# Patient Record
Sex: Male | Born: 1965 | Race: White | Hispanic: No | Marital: Married | Smoking: Never smoker
Health system: Southern US, Community
[De-identification: ages and names within clinical notes are randomized; demographics above are authoritative.]

## PROBLEM LIST (undated history)

## (undated) DIAGNOSIS — E7800 Pure hypercholesterolemia, unspecified: Secondary | ICD-10-CM

## (undated) DIAGNOSIS — E119 Type 2 diabetes mellitus without complications: Secondary | ICD-10-CM

## (undated) DIAGNOSIS — F32A Depression, unspecified: Secondary | ICD-10-CM

## (undated) DIAGNOSIS — Z83719 Family history of colon polyps, unspecified: Secondary | ICD-10-CM

## (undated) DIAGNOSIS — F329 Major depressive disorder, single episode, unspecified: Secondary | ICD-10-CM

## (undated) DIAGNOSIS — K589 Irritable bowel syndrome without diarrhea: Secondary | ICD-10-CM

## (undated) DIAGNOSIS — R319 Hematuria, unspecified: Secondary | ICD-10-CM

## (undated) DIAGNOSIS — Z8371 Family history of colonic polyps: Secondary | ICD-10-CM

## (undated) DIAGNOSIS — T7840XA Allergy, unspecified, initial encounter: Secondary | ICD-10-CM

## (undated) HISTORY — DX: Depression, unspecified: F32.A

## (undated) HISTORY — DX: Allergy, unspecified, initial encounter: T78.40XA

## (undated) HISTORY — DX: Family history of colonic polyps: Z83.71

## (undated) HISTORY — DX: Hematuria, unspecified: R31.9

## (undated) HISTORY — DX: Irritable bowel syndrome, unspecified: K58.9

## (undated) HISTORY — DX: Family history of colon polyps, unspecified: Z83.719

## (undated) HISTORY — DX: Major depressive disorder, single episode, unspecified: F32.9

## (undated) HISTORY — DX: Type 2 diabetes mellitus without complications: E11.9

## (undated) HISTORY — DX: Pure hypercholesterolemia, unspecified: E78.00

---

## 1998-01-16 ENCOUNTER — Other Ambulatory Visit: Admission: RE | Admit: 1998-01-16 | Discharge: 1998-01-16 | Payer: Self-pay | Admitting: Internal Medicine

## 2008-06-24 ENCOUNTER — Encounter: Admission: RE | Admit: 2008-06-24 | Discharge: 2008-06-24 | Payer: Self-pay | Admitting: Family Medicine

## 2010-03-03 ENCOUNTER — Ambulatory Visit (HOSPITAL_COMMUNITY): Admission: RE | Admit: 2010-03-03 | Discharge: 2010-03-03 | Payer: Self-pay | Admitting: Family Medicine

## 2010-08-05 ENCOUNTER — Other Ambulatory Visit (HOSPITAL_COMMUNITY): Payer: Self-pay | Admitting: Cardiology

## 2010-08-08 ENCOUNTER — Encounter: Payer: Self-pay | Admitting: Cardiology

## 2010-08-23 ENCOUNTER — Encounter (HOSPITAL_COMMUNITY): Payer: Self-pay

## 2010-08-23 ENCOUNTER — Other Ambulatory Visit (HOSPITAL_COMMUNITY): Payer: Self-pay

## 2010-08-23 ENCOUNTER — Ambulatory Visit (HOSPITAL_COMMUNITY)
Admission: RE | Admit: 2010-08-23 | Discharge: 2010-08-23 | Disposition: A | Discharge status: Home / Self Care | Payer: 59 | Source: Ambulatory Visit | Attending: Cardiology | Admitting: Cardiology

## 2010-08-23 DIAGNOSIS — R079 Chest pain, unspecified: Secondary | ICD-10-CM | POA: Insufficient documentation

## 2011-11-14 ENCOUNTER — Other Ambulatory Visit (HOSPITAL_COMMUNITY): Payer: Self-pay | Admitting: Family Medicine

## 2011-11-14 DIAGNOSIS — R7989 Other specified abnormal findings of blood chemistry: Secondary | ICD-10-CM

## 2011-11-18 ENCOUNTER — Ambulatory Visit (HOSPITAL_COMMUNITY)
Admission: RE | Admit: 2011-11-18 | Discharge: 2011-11-18 | Disposition: A | Discharge status: Home / Self Care | Payer: 59 | Source: Ambulatory Visit | Attending: Family Medicine | Admitting: Family Medicine

## 2011-11-18 DIAGNOSIS — K7689 Other specified diseases of liver: Secondary | ICD-10-CM | POA: Insufficient documentation

## 2011-11-18 DIAGNOSIS — R7989 Other specified abnormal findings of blood chemistry: Secondary | ICD-10-CM | POA: Insufficient documentation

## 2013-07-18 DIAGNOSIS — E119 Type 2 diabetes mellitus without complications: Secondary | ICD-10-CM

## 2013-07-18 HISTORY — DX: Type 2 diabetes mellitus without complications: E11.9

## 2014-02-07 ENCOUNTER — Ambulatory Visit: Payer: Commercial Managed Care - PPO | Admitting: Dietician

## 2015-07-15 ENCOUNTER — Emergency Department (HOSPITAL_COMMUNITY): Payer: Commercial Managed Care - HMO

## 2015-07-15 ENCOUNTER — Emergency Department (HOSPITAL_COMMUNITY)
Admission: EM | Admit: 2015-07-15 | Discharge: 2015-07-16 | Disposition: A | Discharge status: Home / Self Care | Payer: Commercial Managed Care - HMO | Attending: Physician Assistant | Admitting: Physician Assistant

## 2015-07-15 ENCOUNTER — Encounter (HOSPITAL_COMMUNITY): Payer: Self-pay

## 2015-07-15 DIAGNOSIS — R410 Disorientation, unspecified: Secondary | ICD-10-CM | POA: Insufficient documentation

## 2015-07-15 DIAGNOSIS — Y9389 Activity, other specified: Secondary | ICD-10-CM | POA: Diagnosis not present

## 2015-07-15 DIAGNOSIS — W01198A Fall on same level from slipping, tripping and stumbling with subsequent striking against other object, initial encounter: Secondary | ICD-10-CM | POA: Diagnosis not present

## 2015-07-15 DIAGNOSIS — Z79899 Other long term (current) drug therapy: Secondary | ICD-10-CM | POA: Insufficient documentation

## 2015-07-15 DIAGNOSIS — Z043 Encounter for examination and observation following other accident: Secondary | ICD-10-CM | POA: Insufficient documentation

## 2015-07-15 DIAGNOSIS — K529 Noninfective gastroenteritis and colitis, unspecified: Secondary | ICD-10-CM | POA: Insufficient documentation

## 2015-07-15 DIAGNOSIS — R112 Nausea with vomiting, unspecified: Secondary | ICD-10-CM | POA: Diagnosis present

## 2015-07-15 DIAGNOSIS — Y92002 Bathroom of unspecified non-institutional (private) residence single-family (private) house as the place of occurrence of the external cause: Secondary | ICD-10-CM | POA: Diagnosis not present

## 2015-07-15 DIAGNOSIS — F418 Other specified anxiety disorders: Secondary | ICD-10-CM | POA: Diagnosis not present

## 2015-07-15 DIAGNOSIS — Y998 Other external cause status: Secondary | ICD-10-CM | POA: Insufficient documentation

## 2015-07-15 DIAGNOSIS — E7800 Pure hypercholesterolemia, unspecified: Secondary | ICD-10-CM | POA: Insufficient documentation

## 2015-07-15 DIAGNOSIS — R55 Syncope and collapse: Secondary | ICD-10-CM | POA: Insufficient documentation

## 2015-07-15 DIAGNOSIS — E119 Type 2 diabetes mellitus without complications: Secondary | ICD-10-CM | POA: Diagnosis not present

## 2015-07-15 MED ORDER — SODIUM CHLORIDE 0.9 % IV BOLUS (SEPSIS)
1000.0000 mL | Freq: Once | INTRAVENOUS | Status: AC
  Administered 2015-07-15: 1000 mL via INTRAVENOUS

## 2015-07-15 MED ORDER — ACETAMINOPHEN 325 MG PO TABS
650.0000 mg | ORAL_TABLET | Freq: Once | ORAL | Status: AC
  Administered 2015-07-15: 650 mg via ORAL
  Filled 2015-07-15: qty 2

## 2015-07-15 MED ORDER — IBUPROFEN 800 MG PO TABS
800.0000 mg | ORAL_TABLET | Freq: Once | ORAL | Status: DC
  Administered 2015-07-15: 800 mg via ORAL
  Filled 2015-07-15: qty 1

## 2015-07-15 MED ORDER — ONDANSETRON HCL 4 MG/2ML IJ SOLN
4.0000 mg | Freq: Once | INTRAMUSCULAR | Status: AC
  Administered 2015-07-15: 4 mg via INTRAVENOUS
  Filled 2015-07-15: qty 2

## 2015-07-15 NOTE — ED Notes (Addendum)
Pt comes to Ed via Ems, c/o N/v/d. Pt started feeling bad this morning.  Sent room 2 triage there. 20 in right hand, 4mg  zofran given by ems

## 2015-07-15 NOTE — ED Notes (Signed)
Per EMS patient has had N/V/D x1 day, fell while going to the bathroom with LOC.  Patient stated to EMS that he hit his head.  Patient had Zofran 4mg . And 20 in rt. Hand.

## 2015-07-15 NOTE — ED Provider Notes (Signed)
CSN: [id number]     Arrival date & time 07/15/15  1847 History   First MD Initiated Contact with Patient 07/15/15 1914     Chief Complaint  Patient presents with  . Fall  . Nausea  . Emesis  . Diarrhea  . Loss of Consciousness     (Consider location/radiation/quality/duration/timing/severity/associated sxs/prior Treatment) Patient is a 49 y.o. male presenting with fall, vomiting, diarrhea, and syncope. The history is provided by the patient and medical records. No language interpreter was used.  Fall Associated symptoms include nausea and vomiting. Pertinent negatives include no abdominal pain, arthralgias, chest pain, congestion, coughing, fatigue, fever, headaches, myalgias, neck pain, sore throat or weakness.  Emesis Associated symptoms: diarrhea   Associated symptoms: no abdominal pain, no arthralgias, no headaches, no myalgias and no sore throat   Diarrhea Associated symptoms: vomiting   Associated symptoms: no abdominal pain, no arthralgias, no fever, no headaches and no myalgias   Loss of Consciousness Associated symptoms: confusion, nausea and vomiting   Associated symptoms: no chest pain, no fever, no headaches, no palpitations, no shortness of breath and no weakness    Bobby Lloyd is a 49 y.o. male  who presents to the Emergency Department complaining of n/v/d beginning this am. Patient states he went to visit mother in assisted living facility 2 days ago and his mother was sick with similar sxs. Told at that time that a bug was going around. Patient admits to 2 episodes of NBNB emesis and approx. 15 episodes of watery diarrhea. Associated symptoms include calf muscle cramps, dry mouth. Pt. Was on toilet earlier today and had LOC with BM. Believes he struck head on wall. Thinks he lost consciousness ~ 5 minutes. Admits to feeling disoriented and confused.   Past Medical History  Diagnosis Date  . Diabetes (HCC) 2015  . Hypercholesteremia   . IBS (irritable bowel  syndrome)   . Hematuria   . Allergic     SEASONAL  . FH: colonic polyps   . Depression     ANXIETY   History reviewed. No pertinent past surgical history. No family history on file. Social History  Substance Use Topics  . Smoking status: Never Smoker   . Smokeless tobacco: None  . Alcohol Use: 0.6 - 1.2 oz/week    1-2 Standard drinks or equivalent per week     Comment: OCC DRINKER    Review of Systems  Constitutional: Negative for fever and fatigue.  HENT: Negative for congestion, rhinorrhea and sore throat.   Eyes: Negative for visual disturbance.  Respiratory: Negative for cough, shortness of breath and wheezing.   Cardiovascular: Positive for syncope. Negative for chest pain and palpitations.  Gastrointestinal: Positive for nausea, vomiting and diarrhea. Negative for abdominal pain, constipation and blood in stool.  Genitourinary: Negative for difficulty urinating.  Musculoskeletal: Negative for myalgias, back pain, arthralgias and neck pain.  Neurological: Negative for weakness and headaches.  Psychiatric/Behavioral: Positive for confusion.      Allergies  Lipitor and Tamiflu  Home Medications   Prior to Admission medications   Medication Sig Start Date End Date Taking? Authorizing Provider  albuterol (PROVENTIL HFA;VENTOLIN HFA) 108 (90 BASE) MCG/ACT inhaler Inhale 2 puffs into the lungs every 4 (four) hours as needed for wheezing or shortness of breath.   Yes Historical Provider, MD  citalopram (CELEXA) 20 MG tablet Take 20 mg by mouth daily.   Yes Historical Provider, MD  Probiotic Product (PROBIOTIC PO) Take 1 capsule by mouth at bedtime.  Yes Historical Provider, MD  Pseudoephedrine-Ibuprofen (IBUPROFEN AND PSE COLD & SINUS PO) Take 1 tablet by mouth daily as needed (cold symptoms).   Yes Historical Provider, MD  psyllium (METAMUCIL) 58.6 % packet Take 1 packet by mouth at bedtime.   Yes Historical Provider, MD  rosuvastatin (CRESTOR) 5 MG tablet Take 5 mg by  mouth at bedtime.    Yes Historical Provider, MD  ibuprofen (ADVIL,MOTRIN) 800 MG tablet Take 1 tablet (800 mg total) by mouth 3 (three) times daily. 07/16/15   Chase Picket Ward, PA-C  ondansetron (ZOFRAN ODT) 4 MG disintegrating tablet Take 1 tablet (4 mg total) by mouth every 8 (eight) hours as needed for nausea or vomiting. 07/16/15   Chase Picket Ward, PA-C   BP 116/71 mmHg  Pulse 105  Temp(Src) 98.8 F (37.1 C) (Oral)  Resp 18  Ht  (1.727 m)  Wt 99.791 kg  BMI 33.46 kg/m2  SpO2 96% Physical Exam  Constitutional: He is oriented to person, place, and time. He appears well-developed and well-nourished.  NAD  HENT:  Head: Normocephalic and atraumatic.  Neck: Normal range of motion. Neck supple. No JVD present.  Full ROM No midline tenderness  Tenderness to paraspinal musculature  Cardiovascular: Normal rate, regular rhythm and normal heart sounds.  Exam reveals no gallop and no friction rub.   No murmur heard. ntact and equal pulses in all four extremities  Pulmonary/Chest: Effort normal and breath sounds normal. No respiratory distress. He has no wheezes. He has no rales.  Abdominal:  Soft, non-distended Generalized abdominal tenderness  Musculoskeletal: He exhibits no edema.  Neurological: He is alert and oriented to person, place, and time.  Alert, oriented, thought content appropriate, able to give a coherent history. Speech is clear and goal oriented, able to follow commands.  Cranial Nerves:  II:  Peripheral visual fields grossly normal, pupils equal, round, reactive to light III, IV, VI: EOM intact bilaterally, ptosis not present V,VII: smile symmetric, eyes kept closed tightly against resistance, facial light touch sensation equal VIII: hearing grossly normal IX, X: symmetric soft palate movement, uvula elevates symmetrically  XI: bilateral shoulder shrug symmetric and strong XII: midline tongue extension 5/5 muscle strength in upper and lower extremities  bilaterally including strong and equal grip strength and dorsiflexion/plantar flexion Sensory to light touch normal in all four extremities.  Normal finger-to-nose and rapid alternating movements  Skin:  Dry skin  Psychiatric: He has a normal mood and affect. His behavior is normal. Judgment and thought content normal.  Nursing note and vitals reviewed.   ED Course  Procedures (including critical care time) Labs Review Labs Reviewed - No data to display  Imaging Review Ct Head Wo Contrast  07/15/2015  CLINICAL DATA:  Patient status post fall in the bathroom. Positive loss of consciousness. EXAM: CT HEAD WITHOUT CONTRAST CT CERVICAL SPINE WITHOUT CONTRAST TECHNIQUE: Multidetector CT imaging of the head and cervical spine was performed following the standard protocol without intravenous contrast. Multiplanar CT image reconstructions of the cervical spine were also generated. COMPARISON:  MRI C-spine 03/03/2010 FINDINGS: CT HEAD FINDINGS Ventricles and sulci are appropriate for patient's age. No evidence for acute cortically based infarct, intracranial hemorrhage, mass lesion mass-effect. Orbits are unremarkable. Paranasal sinuses are unremarkable. Mastoid air cells are unremarkable. Calvarium is intact. 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 Normal anatomic alignment. Mild anterior osteophytosis C4-5 and C5-6. No evidence for acute cervical spine fracture. Craniocervical junction is intact. Prevertebral soft  tissues are unremarkable. Visualized thyroid is unremarkable. Lung apices are clear. IMPRESSION: No acute intracranial process. No acute cervical spine fracture. Electronically Signed   By: Annia Beltrew  Davis M.D.   On: 07/15/2015 21:35   Ct Cervical Spine Wo Contrast  07/15/2015  CLINICAL DATA:  Patient status post fall in the bathroom. Positive loss of consciousness. EXAM: CT HEAD WITHOUT CONTRAST CT CERVICAL SPINE WITHOUT CONTRAST TECHNIQUE: Multidetector CT  imaging of the head and cervical spine was performed following the standard protocol without intravenous contrast. Multiplanar CT image reconstructions of the cervical spine were also generated. COMPARISON:  MRI C-spine 03/03/2010 FINDINGS: CT HEAD FINDINGS Ventricles and sulci are appropriate for patient's age. No evidence for acute cortically based infarct, intracranial hemorrhage, mass lesion mass-effect. Orbits are unremarkable. Paranasal sinuses are unremarkable. Mastoid air cells are unremarkable. Calvarium is intact. 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 Normal anatomic alignment. Mild anterior osteophytosis C4-5 and C5-6. No evidence for acute cervical spine fracture. Craniocervical junction is intact. Prevertebral soft tissues are unremarkable. Visualized thyroid is unremarkable. Lung apices are clear. IMPRESSION: No acute intracranial process. No acute cervical spine fracture. Electronically Signed   By: Annia Beltrew  Davis M.D.   On: 07/15/2015 21:35   I have personally reviewed and evaluated these images and lab results as part of my medical decision-making.   EKG Interpretation None      MDM   Final diagnoses:  Gastroenteritis  Syncope and collapse   Albesa SeenRobert Passarelli presents with n/v/d likely viral and syncopal event while on commode likely reflex syncope  Imaging: CT shows no acute intracranial process, no acute c-spine abnormalities.   Therapeutics:Zofran, IV fluids, tylenol for fever PO challenge - water and crackers with no emesis.  A&P: Gastroenteritis   - Zofran as needed for nausea  - PCP follow up  - Increase hydration Neck pain  - Ibuprofen  - Pt. Has flexeril at home; informed patient that would be okay to take as well for pain. Discussed side effects.  Return precautions and home care instructions given.   Patient discussed with Dr. Corlis LeakMackuen who agrees with treatment plan.   [facility] Nursing Care CenterJaime Pilcher Ward, PA-C 07/16/15 0114  Courteney  Randall AnLyn Mackuen, MD 07/16/15 [id number]

## 2015-07-16 MED ORDER — ONDANSETRON 8 MG PO TBDP
8.0000 mg | ORAL_TABLET | Freq: Once | ORAL | Status: AC
  Administered 2015-07-16: 8 mg via ORAL
  Filled 2015-07-16: qty 1

## 2015-07-16 MED ORDER — IBUPROFEN 800 MG PO TABS: 800.0000 mg | ORAL_TABLET | Freq: Three times a day (TID) | ORAL | Status: DC

## 2015-07-16 MED ORDER — ONDANSETRON 4 MG PO TBDP: 4.0000 mg | ORAL_TABLET | Freq: Three times a day (TID) | ORAL | Status: DC | PRN

## 2015-07-16 NOTE — Discharge Instructions (Signed)
1. Medications: zofran as needed for nausea/vomiting, ibuprofen for neck pain, continue usual home medications 2. Treatment: rest, drink plenty of fluids 3. Follow Up: Please follow up with your primary doctor in 3 days for discussion of your diagnoses and further evaluation after today's visit Please return to the ER for fever not controlled with tylenol, any new or worsening condition, any additional concerns.

## 2017-01-09 ENCOUNTER — Other Ambulatory Visit: Payer: Self-pay | Admitting: Family Medicine

## 2017-01-09 DIAGNOSIS — R109 Unspecified abdominal pain: Secondary | ICD-10-CM

## 2017-01-12 ENCOUNTER — Ambulatory Visit
Admission: RE | Admit: 2017-01-12 | Discharge: 2017-01-12 | Disposition: A | Discharge status: Home / Self Care | Payer: Commercial Managed Care - HMO | Source: Ambulatory Visit | Attending: Family Medicine | Admitting: Family Medicine

## 2017-01-12 DIAGNOSIS — R109 Unspecified abdominal pain: Secondary | ICD-10-CM

## 2017-01-19 ENCOUNTER — Other Ambulatory Visit: Payer: Commercial Managed Care - PPO

## 2017-02-13 ENCOUNTER — Other Ambulatory Visit: Payer: Self-pay | Admitting: Gastroenterology

## 2017-02-13 DIAGNOSIS — R748 Abnormal levels of other serum enzymes: Secondary | ICD-10-CM

## 2017-02-13 DIAGNOSIS — R1013 Epigastric pain: Secondary | ICD-10-CM

## 2017-02-17 ENCOUNTER — Other Ambulatory Visit: Payer: Commercial Managed Care - HMO

## 2017-04-17 IMAGING — CT CT CERVICAL SPINE W/O CM
4 of 6 series · 13 of 33 positions shown, 15 images · non-contrast
Comparison: MRI C-spine 03/03/2010

CLINICAL DATA: Patient status post fall in the bathroom. Positive
loss of consciousness.

EXAM:
CT HEAD WITHOUT CONTRAST
CT CERVICAL SPINE WITHOUT CONTRAST
TECHNIQUE: Multidetector CT imaging of the head and cervical spine was
performed following the standard protocol without intravenous
contrast. Multiplanar CT image reconstructions of the cervical spine
were also generated.

[Series 3: c-spine st · axial · 0.32mm/px · z∈[-250,-194]mm · 2 of 84 slices shown]
[im 28/84  bone]
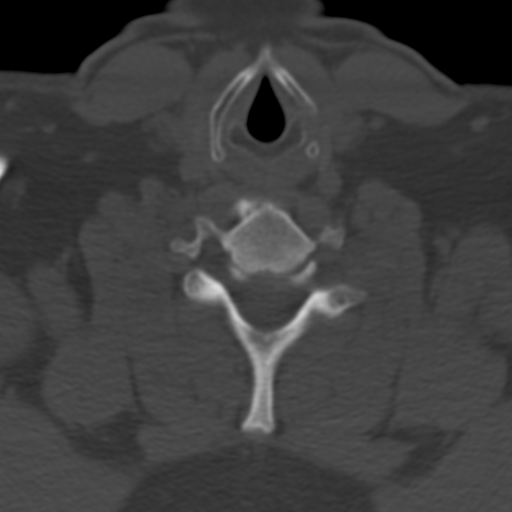
[im 56/84  bone]
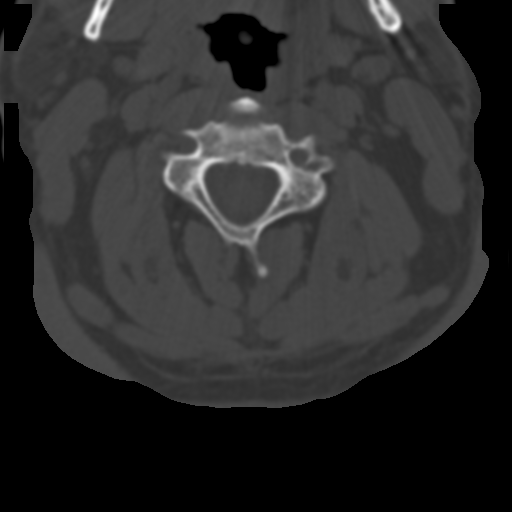

[Series 8: axial recon · axial · 0.23mm/px · z∈[-288,-197]mm · 3 of 99 slices shown, 4 images]
[im 25/99  soft-tissue]
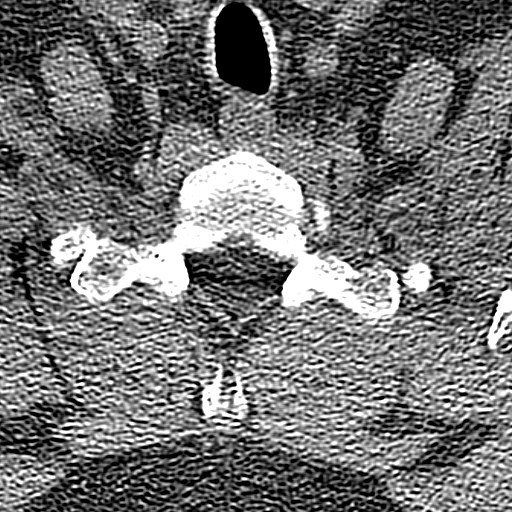
[im 25/99  bone]
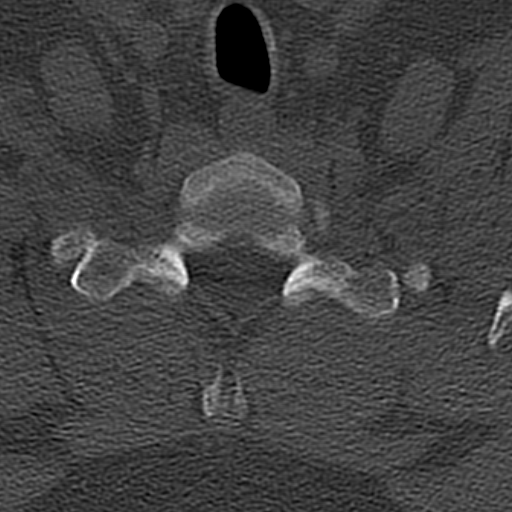
[im 50/99  bone]
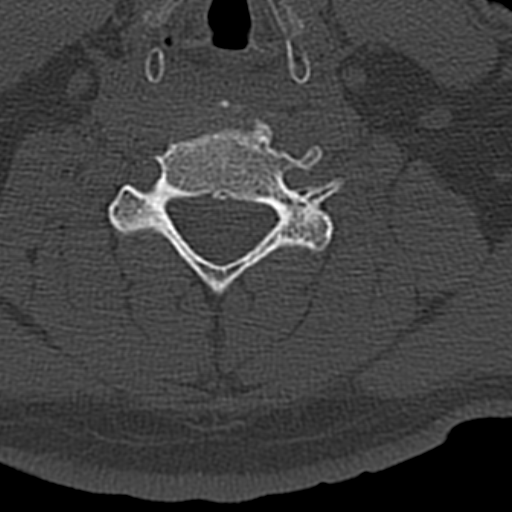
[im 74/99  bone]
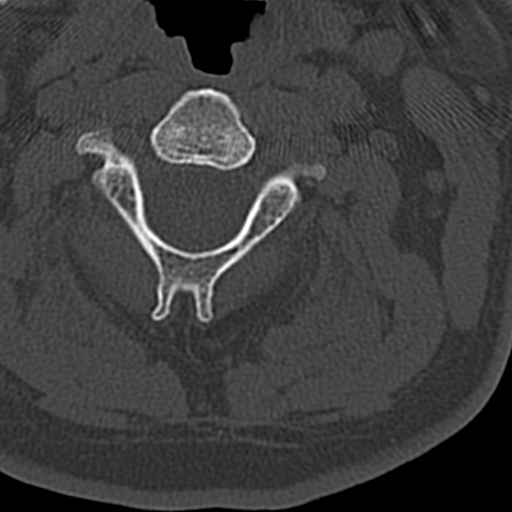

[Series 9: coronal · coronal · 0.24mm/px · 3 of 61 slices shown]
[im 13/61  bone]
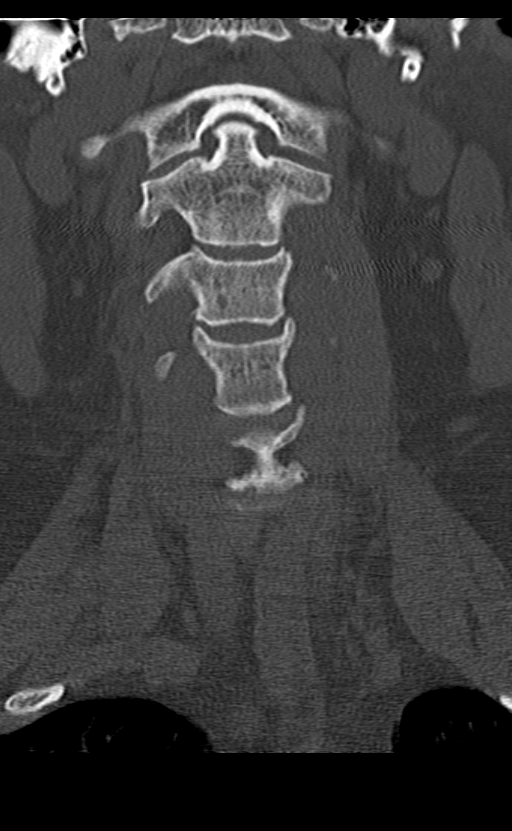
[im 25/61  bone]
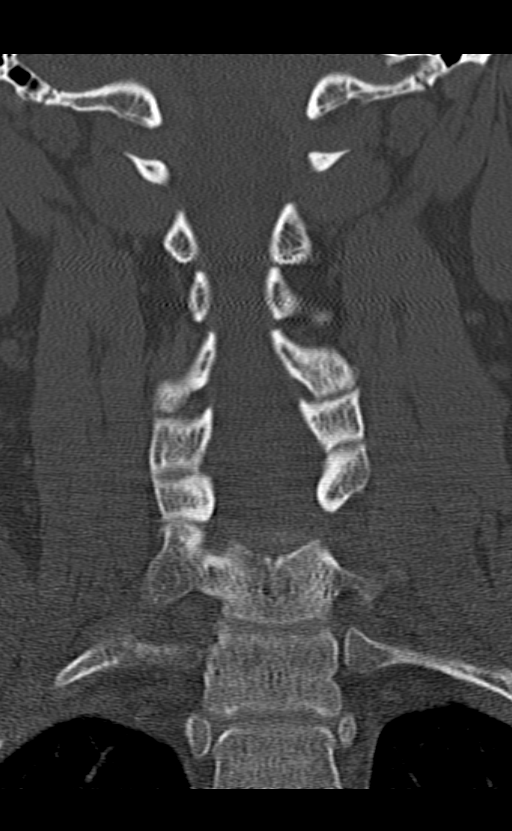
[im 37/61  bone]
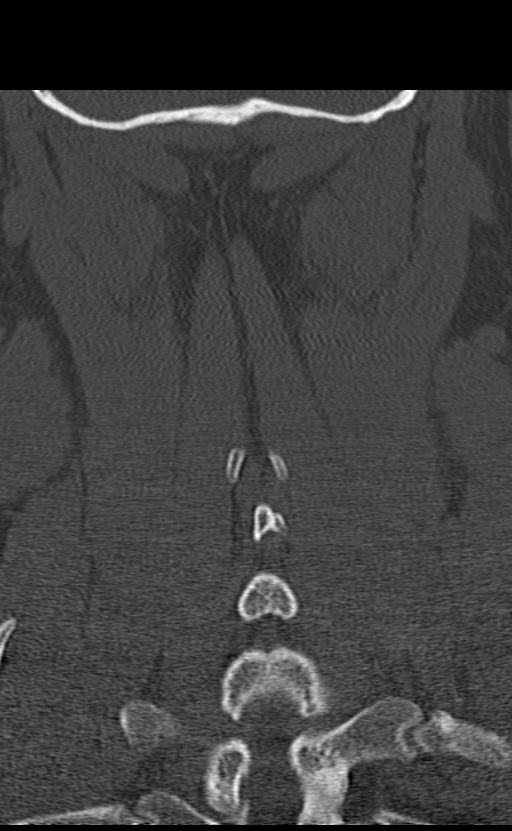

[Series 10: sagittal · sagittal · 0.33mm/px · 5 of 61 slices shown, 6 images]
[im 21/61  bone]
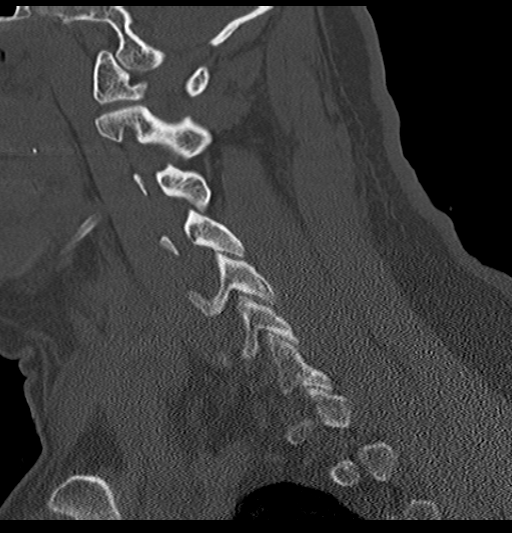
[im 26/61  bone]
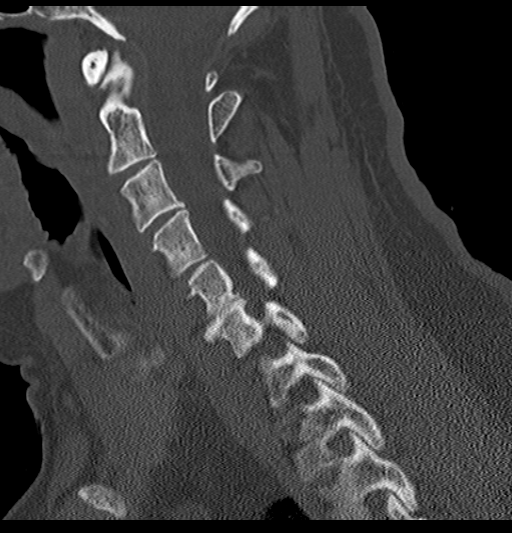
[im 31/61  soft-tissue]
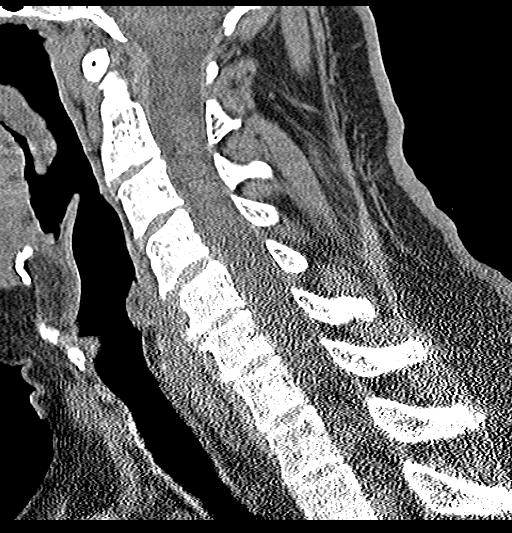
[im 31/61  bone]
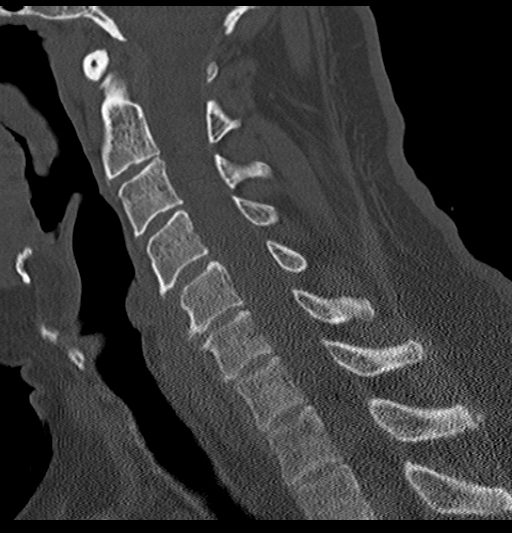
[im 36/61  bone]
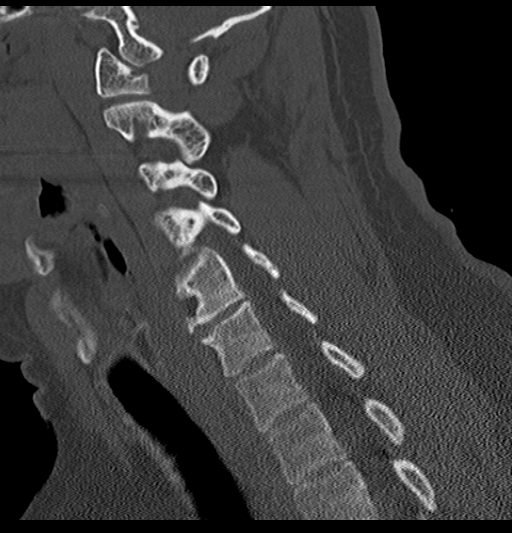
[im 41/61  bone]
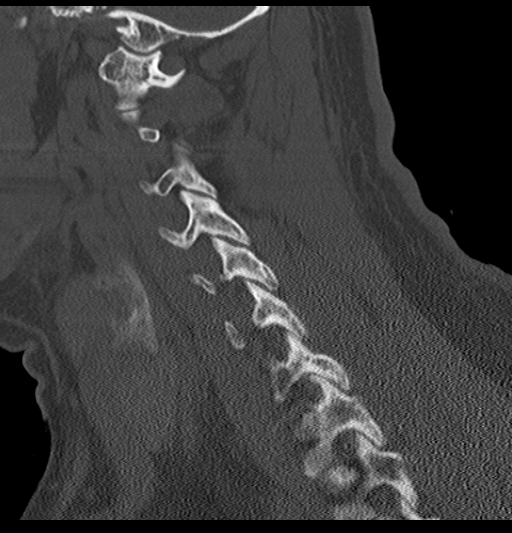

[13 of 33 positions shown; findings below may reference images not displayed]

FINDINGS: CT HEAD FINDINGS

Ventricles and sulci are appropriate for patient's age. No evidence
for acute cortically based infarct, intracranial hemorrhage, mass
lesion mass-effect. Orbits are unremarkable. Paranasal sinuses are
unremarkable. Mastoid air cells are unremarkable. Calvarium is
intact.

CT CERVICAL SPINE FINDINGS

Congenital non fusion of the posterior arch of C1. Normal anatomic
alignment. Mild anterior osteophytosis C4-5 and C5-6. No evidence
for acute cervical spine fracture. Craniocervical junction is
intact. Prevertebral soft tissues are unremarkable. Visualized
thyroid is unremarkable. Lung apices are clear.
IMPRESSION: No acute intracranial process.

No acute cervical spine fracture.

## 2017-06-05 ENCOUNTER — Inpatient Hospital Stay
Admission: RE | Admit: 2017-06-05 | Discharge: 2017-06-05 | Disposition: A | Discharge status: Home / Self Care | Payer: 59 | Source: Ambulatory Visit | Attending: Gastroenterology | Admitting: Gastroenterology

## 2017-08-10 IMAGING — US US ABDOMEN COMPLETE
1 series · 14 of 25 positions shown · non-contrast
Comparison: 11/18/2011 .

CLINICAL DATA: Abdominal pain .

EXAM:
ABDOMEN ULTRASOUND COMPLETE

[Series 1: us abdomen complete · 0.22mm/px · 14 of 82 slices shown]
[im 1/82]
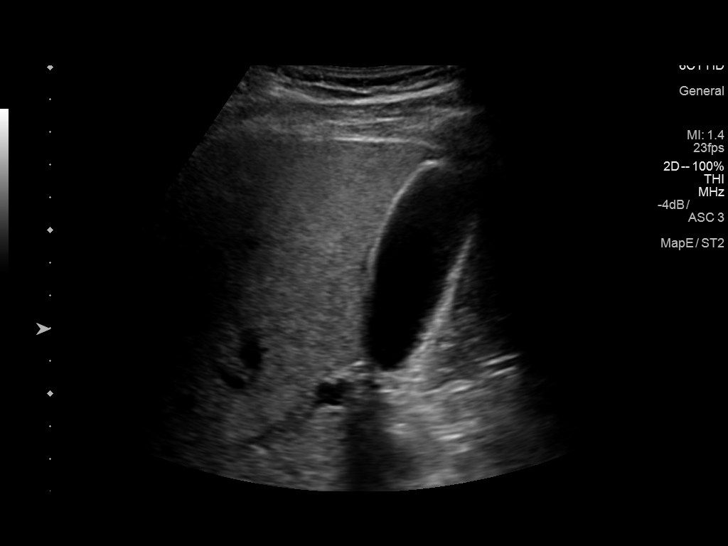
[im 7/82]
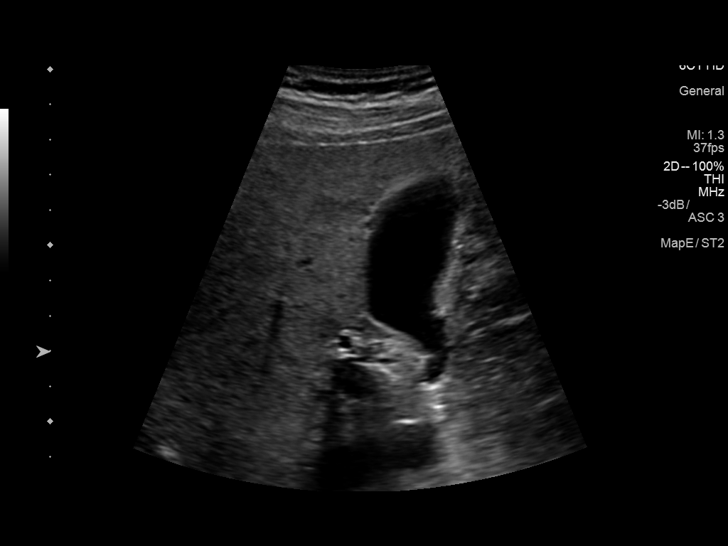
[im 14/82]
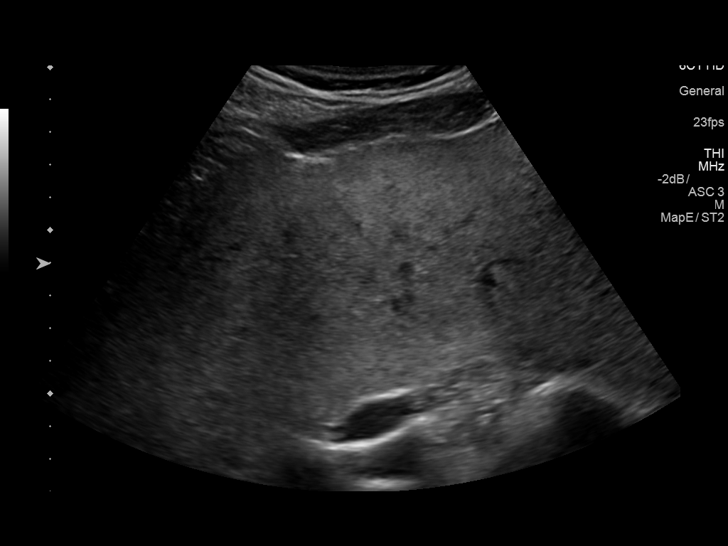
[im 21/82]
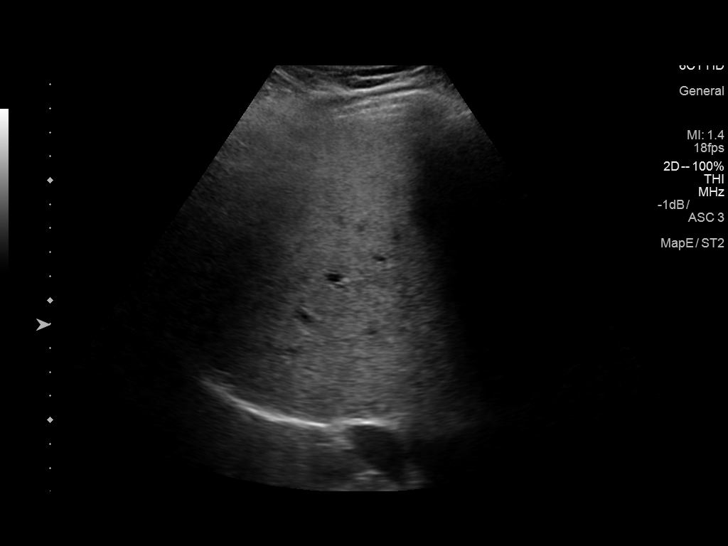
[im 28/82]
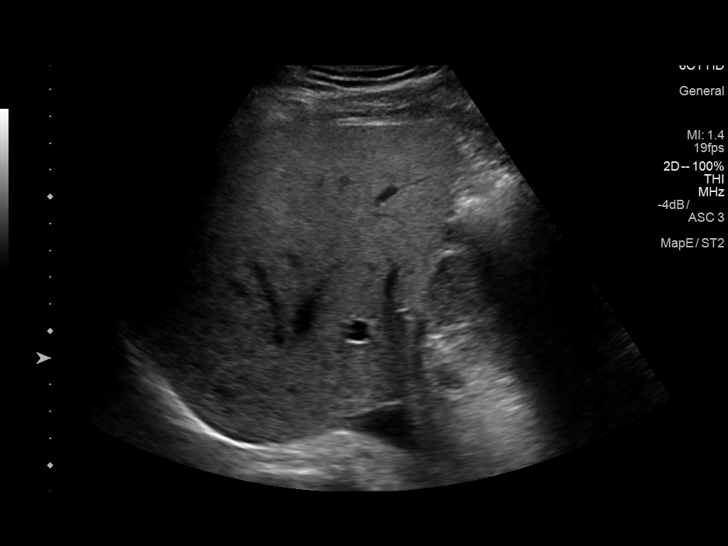
[im 31/82]
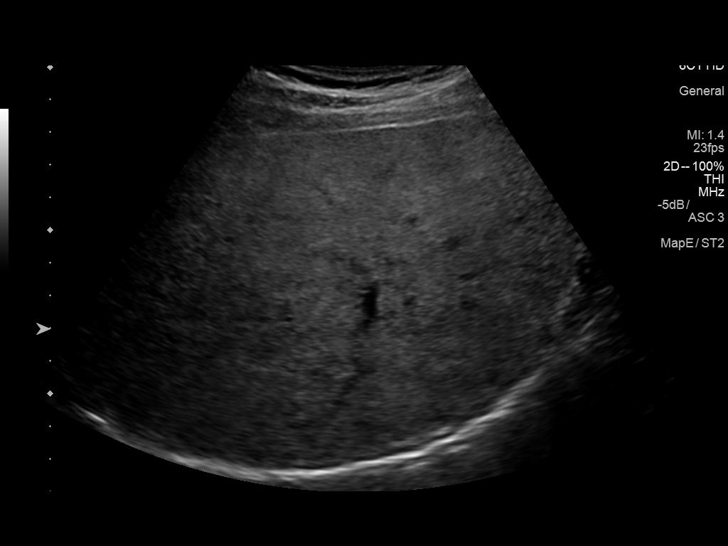
[im 38/82]
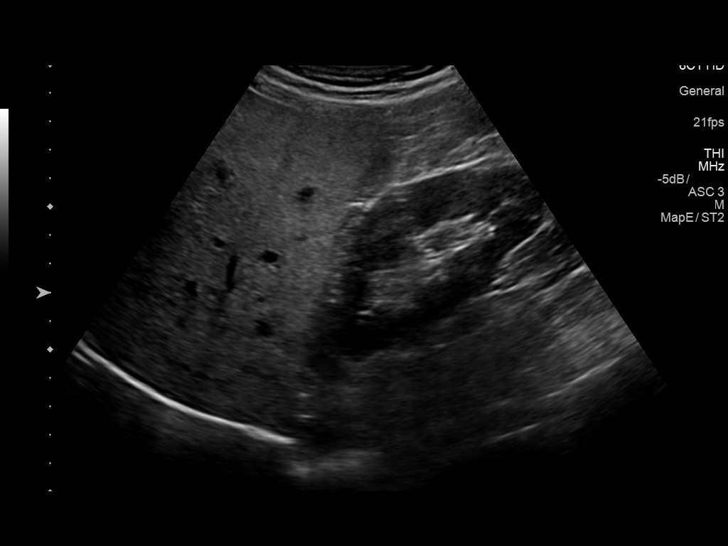
[im 44/82]
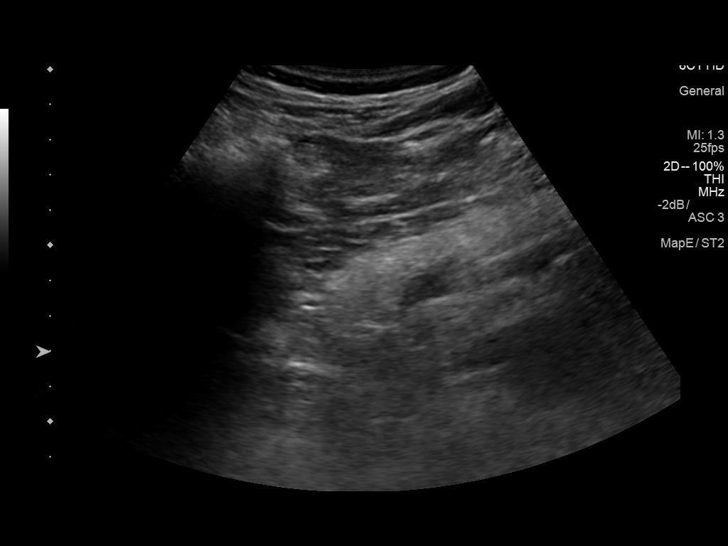
[im 51/82]
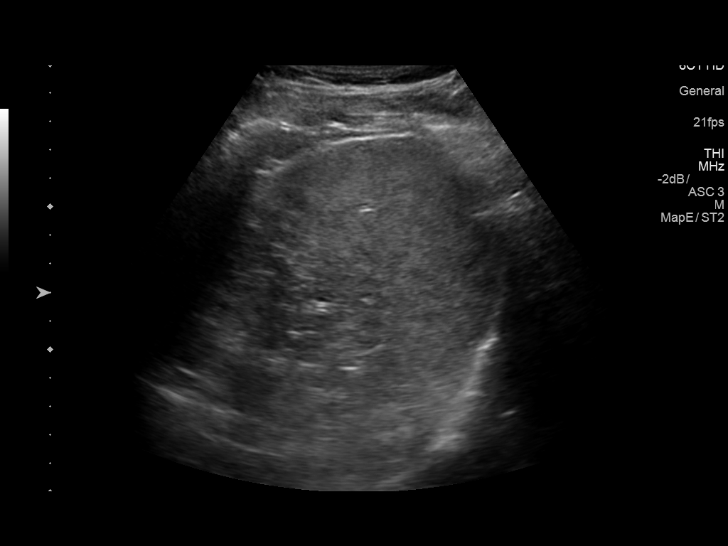
[im 55/82]
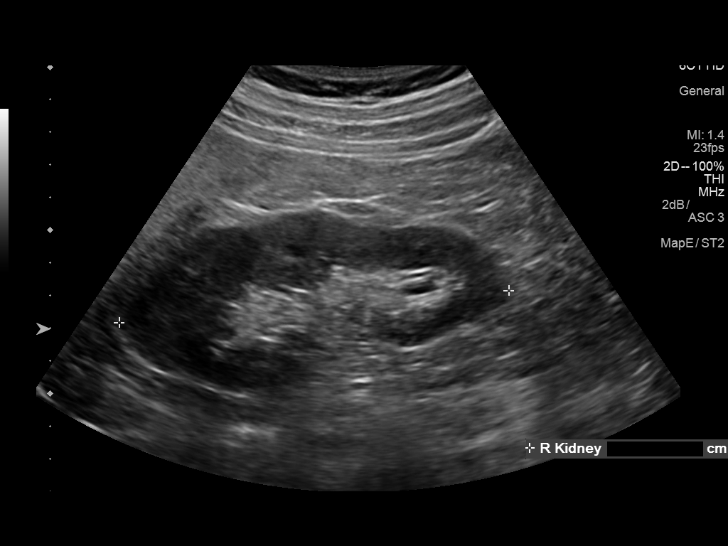
[im 61/82]
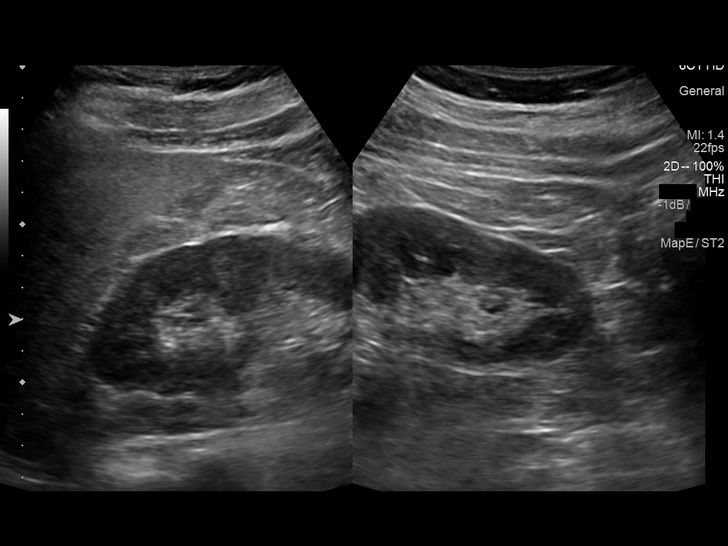
[im 68/82]
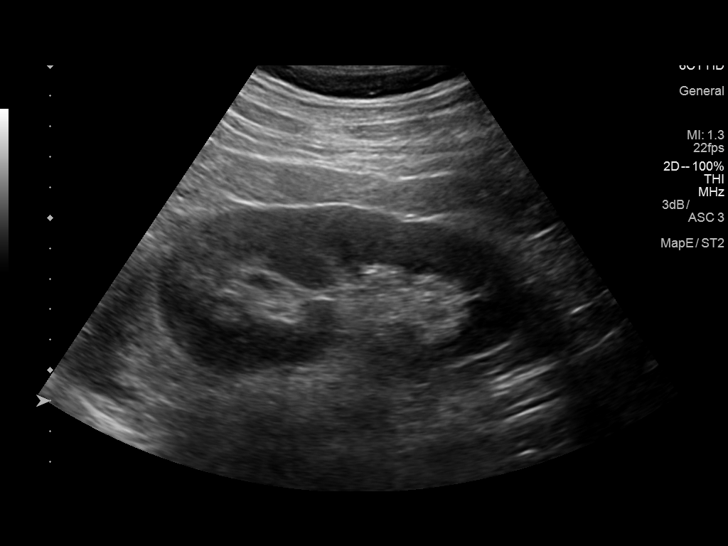
[im 75/82]
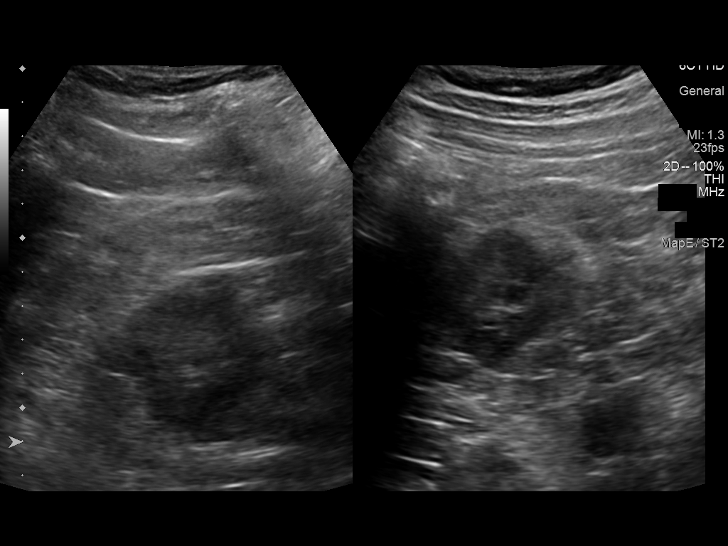
[im 82/82]
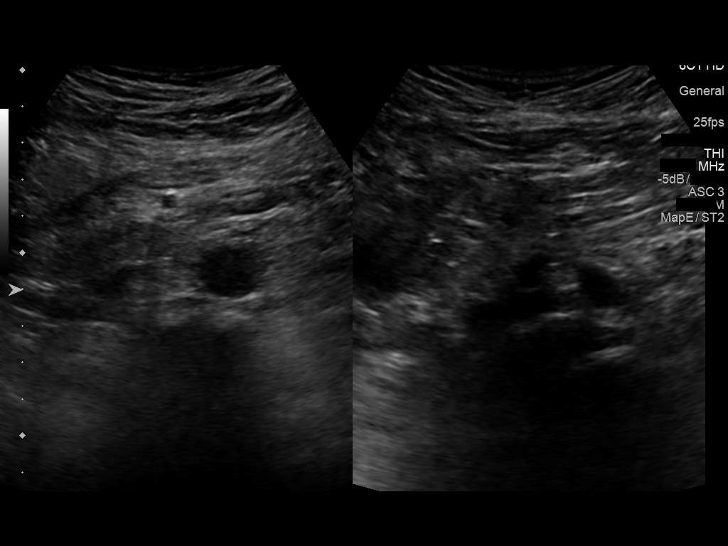

[14 of 25 positions shown; findings below may reference images not displayed]

FINDINGS: Gallbladder: No gallstones or wall thickening visualized. No
sonographic Murphy sign noted by sonographer.

Common bile duct: Diameter: 3.2 mm

Liver: There is echogenic consistent fatty infiltration. Hypoechoic
region noted adjacent to the gallbladder fossa most likely focal
fatty sparing. This is new from prior exam. To exclude a focal
lesion MRI can be obtained as needed. Limits in parenchymal
echogenicity.

IVC: No abnormality visualized.

Pancreas: Visualized portion unremarkable.

Spleen: Size and appearance within normal limits.

Right Kidney: Length: 12.0 cm. Echogenicity within normal limits. No
mass or hydronephrosis visualized.

Left Kidney: Length: 12.5 cm. Echogenicity within normal limits. No
mass or hydronephrosis visualized.

Abdominal aorta: No aneurysm visualized.

Other findings: None.
IMPRESSION: Findings most consistent with fatty infiltration of the liver with
focal fatty sparing adjacent to the gallbladder fossa.

## 2017-08-17 ENCOUNTER — Other Ambulatory Visit: Payer: Self-pay | Admitting: Gastroenterology

## 2017-08-17 DIAGNOSIS — R7989 Other specified abnormal findings of blood chemistry: Secondary | ICD-10-CM

## 2017-08-17 DIAGNOSIS — R945 Abnormal results of liver function studies: Secondary | ICD-10-CM

## 2017-08-17 DIAGNOSIS — R109 Unspecified abdominal pain: Secondary | ICD-10-CM

## 2017-08-17 DIAGNOSIS — R1013 Epigastric pain: Secondary | ICD-10-CM | POA: Diagnosis not present

## 2017-08-17 DIAGNOSIS — R748 Abnormal levels of other serum enzymes: Secondary | ICD-10-CM | POA: Diagnosis not present

## 2017-08-24 ENCOUNTER — Ambulatory Visit
Admission: RE | Admit: 2017-08-24 | Discharge: 2017-08-24 | Disposition: A | Discharge status: Home / Self Care | Payer: 59 | Source: Ambulatory Visit | Attending: Gastroenterology | Admitting: Gastroenterology

## 2017-08-24 DIAGNOSIS — R109 Unspecified abdominal pain: Secondary | ICD-10-CM

## 2017-08-24 DIAGNOSIS — R945 Abnormal results of liver function studies: Secondary | ICD-10-CM

## 2017-08-24 DIAGNOSIS — R7989 Other specified abnormal findings of blood chemistry: Secondary | ICD-10-CM

## 2017-08-24 DIAGNOSIS — R197 Diarrhea, unspecified: Secondary | ICD-10-CM | POA: Diagnosis not present

## 2017-08-24 MED ORDER — IOPAMIDOL (ISOVUE-300) INJECTION 61%
100.0000 mL | Freq: Once | INTRAVENOUS | Status: AC | PRN
  Administered 2017-08-24: 125 mL via INTRAVENOUS

## 2017-11-14 DIAGNOSIS — R748 Abnormal levels of other serum enzymes: Secondary | ICD-10-CM | POA: Diagnosis not present

## 2017-11-22 DIAGNOSIS — K210 Gastro-esophageal reflux disease with esophagitis: Secondary | ICD-10-CM | POA: Diagnosis not present

## 2017-11-22 DIAGNOSIS — K589 Irritable bowel syndrome without diarrhea: Secondary | ICD-10-CM | POA: Diagnosis not present

## 2017-12-29 DIAGNOSIS — E1121 Type 2 diabetes mellitus with diabetic nephropathy: Secondary | ICD-10-CM | POA: Diagnosis not present

## 2017-12-29 DIAGNOSIS — Z125 Encounter for screening for malignant neoplasm of prostate: Secondary | ICD-10-CM | POA: Diagnosis not present

## 2017-12-29 DIAGNOSIS — N183 Chronic kidney disease, stage 3 (moderate): Secondary | ICD-10-CM | POA: Diagnosis not present

## 2017-12-29 DIAGNOSIS — Z0000 Encounter for general adult medical examination without abnormal findings: Secondary | ICD-10-CM | POA: Diagnosis not present

## 2018-05-04 DIAGNOSIS — Z23 Encounter for immunization: Secondary | ICD-10-CM | POA: Diagnosis not present

## 2018-07-13 DIAGNOSIS — N183 Chronic kidney disease, stage 3 (moderate): Secondary | ICD-10-CM | POA: Diagnosis not present

## 2018-07-13 DIAGNOSIS — E1121 Type 2 diabetes mellitus with diabetic nephropathy: Secondary | ICD-10-CM | POA: Diagnosis not present

## 2018-11-28 DIAGNOSIS — L814 Other melanin hyperpigmentation: Secondary | ICD-10-CM | POA: Diagnosis not present

## 2018-11-28 DIAGNOSIS — L720 Epidermal cyst: Secondary | ICD-10-CM | POA: Diagnosis not present

## 2018-11-28 DIAGNOSIS — L918 Other hypertrophic disorders of the skin: Secondary | ICD-10-CM | POA: Diagnosis not present

## 2019-05-29 IMAGING — CT CT ABD-PELV W/ CM
1 of 3 series · 14 of 32 positions shown, 19 images · IV contrast (APPLIED)
Comparison: None.

CLINICAL DATA: Right upper quadrant and left lower quadrant
abdominal pain, bloating, gas and diarrhea for 1 year.

EXAM:
CT ABDOMEN AND PELVIS WITH CONTRAST
TECHNIQUE: Multidetector CT imaging of the abdomen and pelvis was performed
using the standard protocol following bolus administration of
intravenous contrast.
CONTRAST:  125mL 5660CV-UKK IOPAMIDOL (5660CV-UKK) INJECTION 61%

[Series 2: abd/pelvis w/cm · axial · 0.84mm/px · z∈[+485,+965]mm · 14 of 108 slices shown, 19 images]
[im 6/108  soft-tissue]
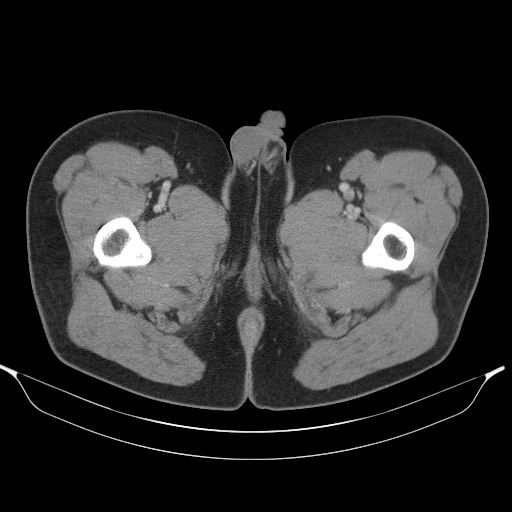
[im 6/108  bone]
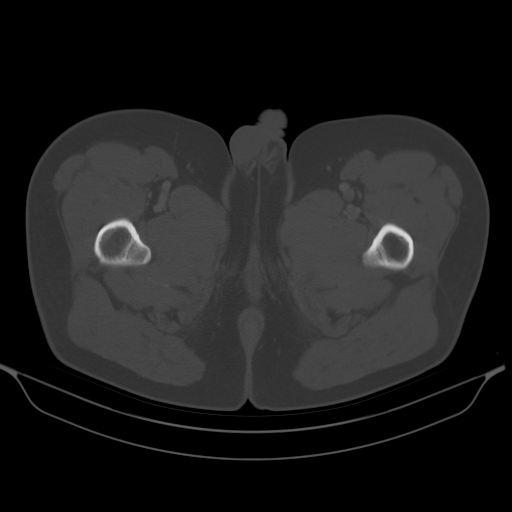
[im 17/108  soft-tissue]
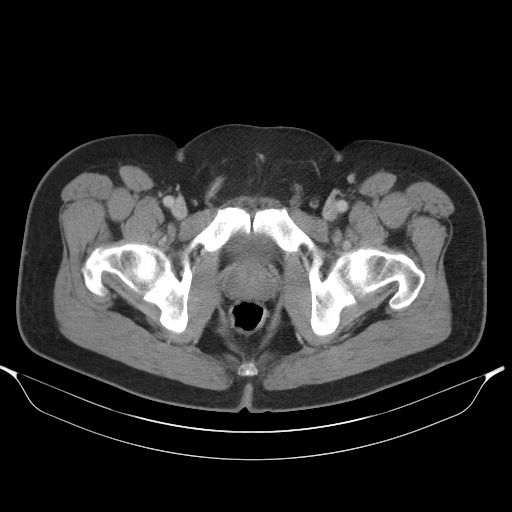
[im 22/108  soft-tissue]
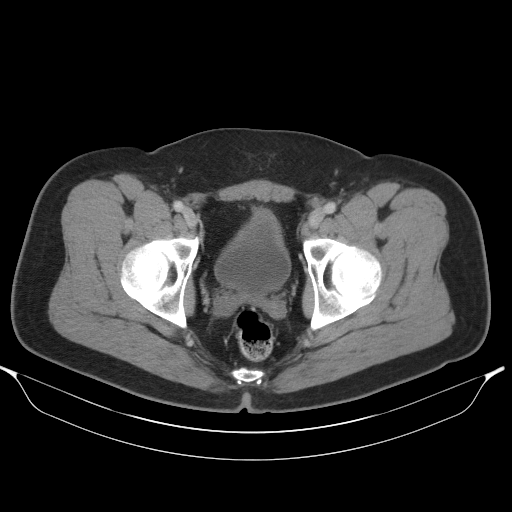
[im 33/108  soft-tissue]
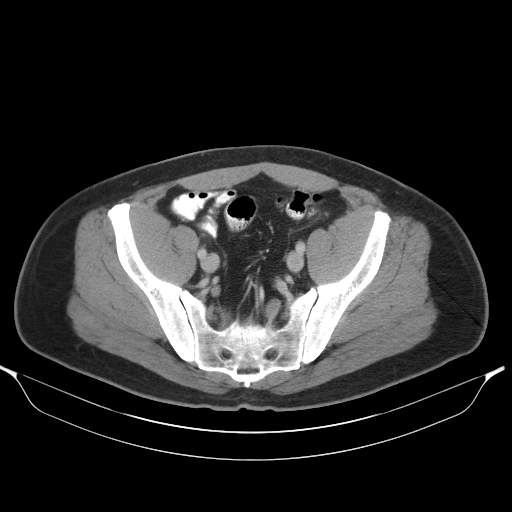
[im 38/108  soft-tissue]
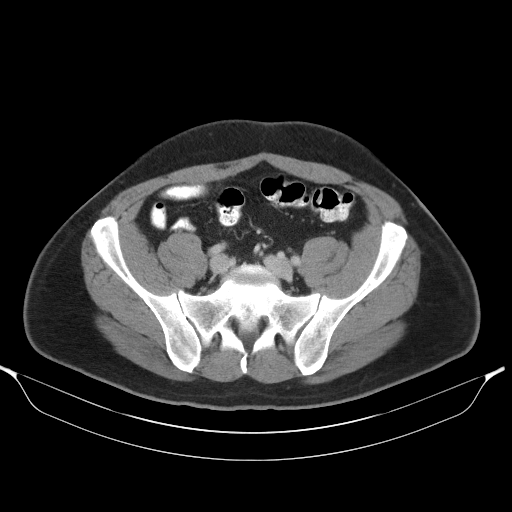
[im 49/108  soft-tissue]
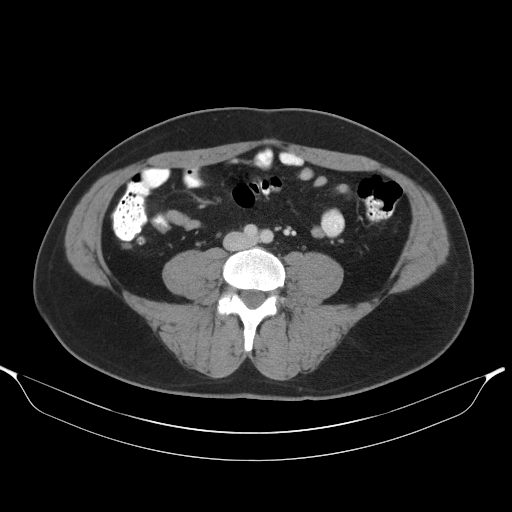
[im 54/108  soft-tissue]
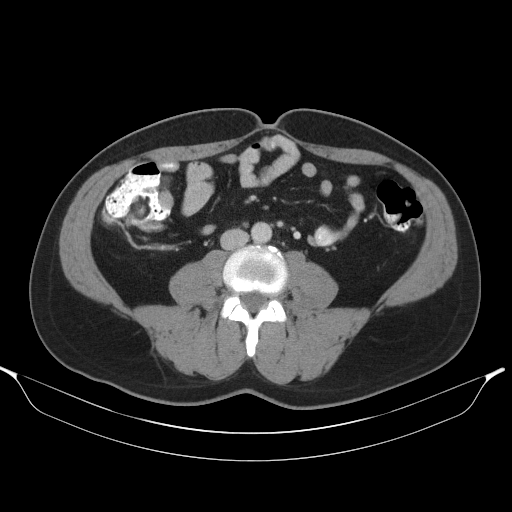
[im 59/108  soft-tissue]
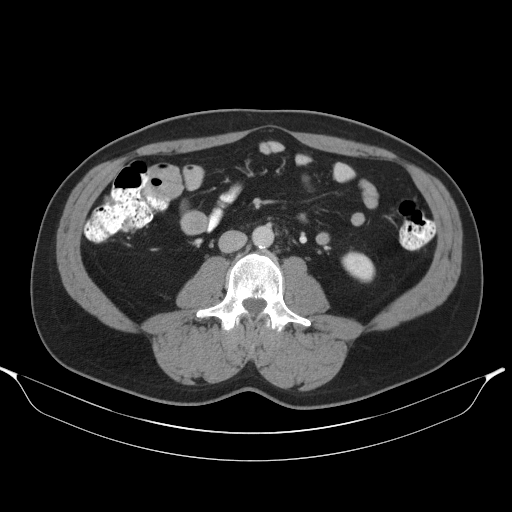
[im 70/108  soft-tissue]
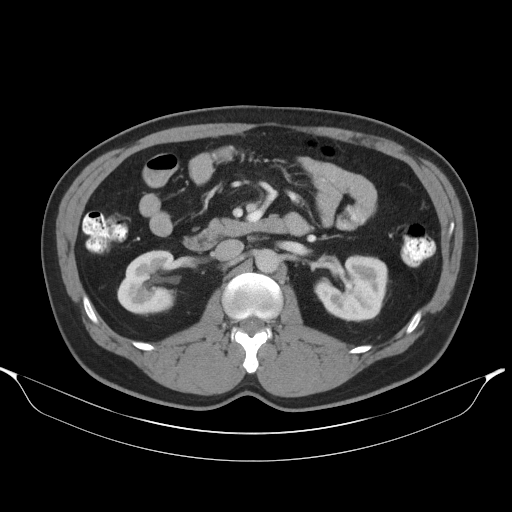
[im 70/108  bone]
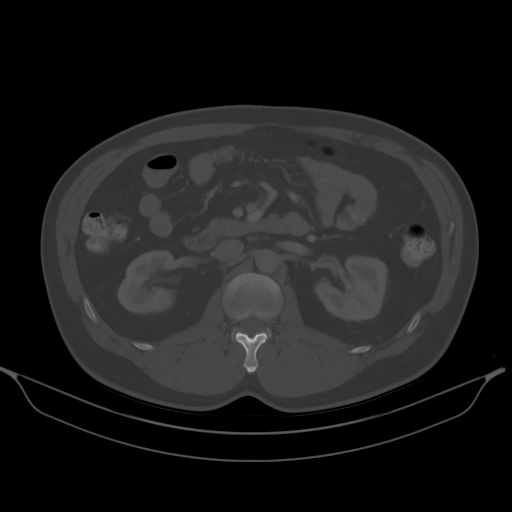
[im 75/108  soft-tissue]
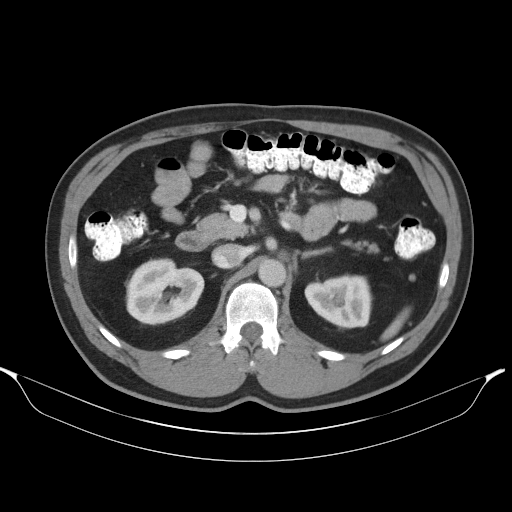
[im 86/108  soft-tissue]
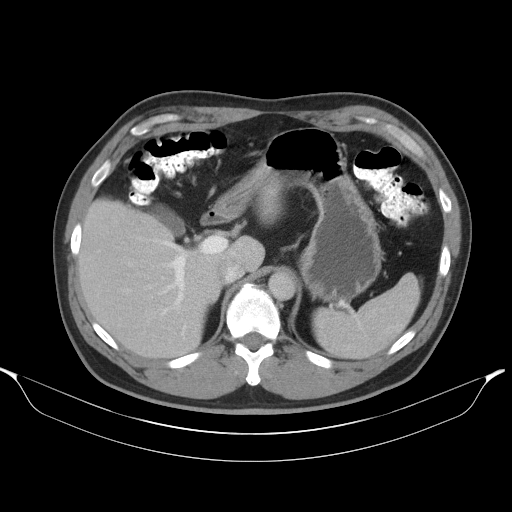
[im 86/108  lung]
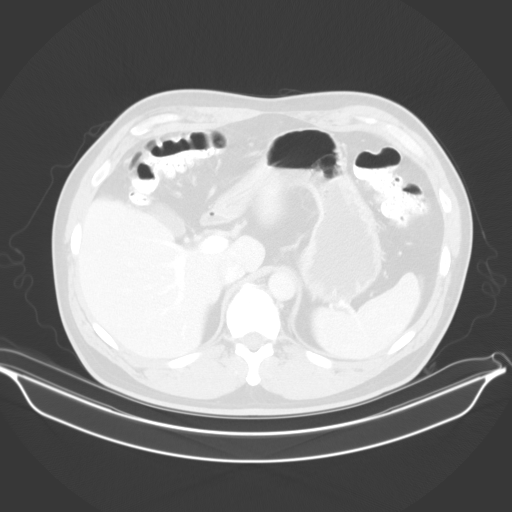
[im 91/108  soft-tissue]
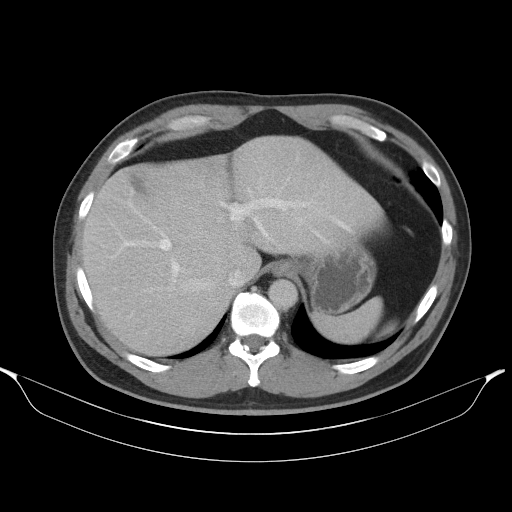
[im 91/108  lung]
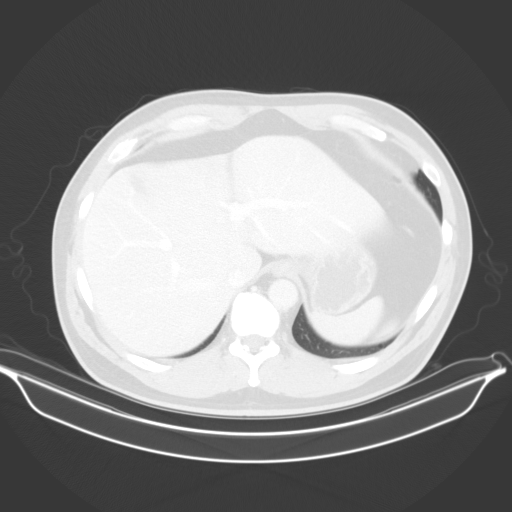
[im 97/108  lung]
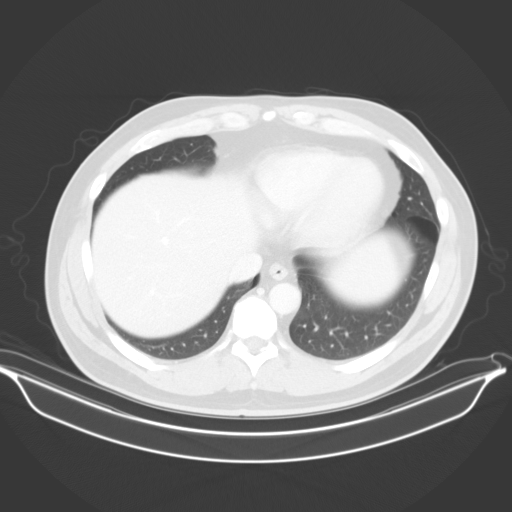
[im 102/108  soft-tissue]
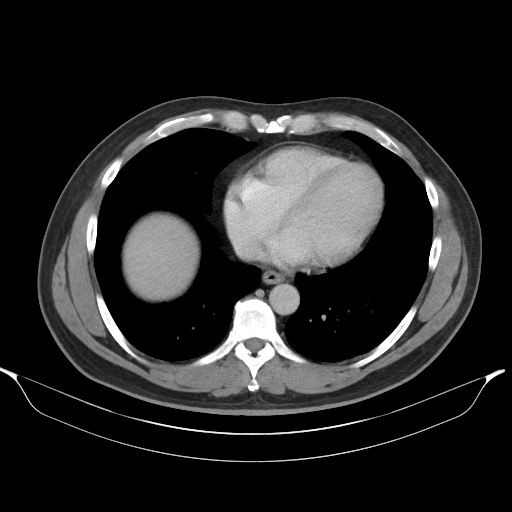
[im 102/108  lung]
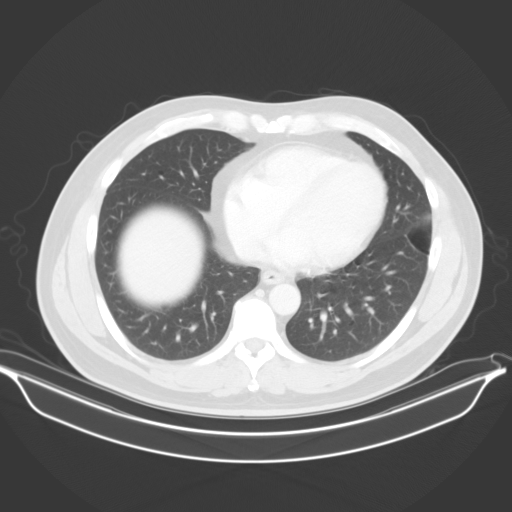

[14 of 32 positions shown; findings below may reference images not displayed]

FINDINGS: Lower chest: Scattered cystic changes in the lungs. No infiltrates
or effusions. The heart is normal in size. No pericardial effusion.
No significant vascular calcifications. The distal esophagus is
grossly normal.

Hepatobiliary: No focal hepatic lesions or intrahepatic biliary
dilatation. The gallbladder is normal. No common bile duct
dilatation.

Pancreas: No mass, inflammation or ductal dilatation.

Spleen: Normal size.  No focal lesions.

Adrenals/Urinary Tract: The adrenal glands and kidneys are
unremarkable. The bladder is normal.

Stomach/Bowel: The stomach, duodenum, small bowel and colon are
unremarkable. No acute inflammatory changes, mass lesions or
obstructive findings. The terminal ileum is normal. The appendix is
normal.

Vascular/Lymphatic: The aorta is normal in caliber. No dissection.
The branch vessels are patent. The major venous structures are
patent. No mesenteric or retroperitoneal mass or adenopathy. Small
scattered lymph nodes are noted.

Reproductive: The prostate gland and seminal vesicles are
unremarkable.

Other: No pelvic mass or adenopathy. No free pelvic fluid
collections. No inguinal mass or adenopathy. No abdominal wall
hernia or subcutaneous lesions.

Musculoskeletal: No significant bony findings.
IMPRESSION: 1. No acute abdominal/pelvic findings, mass lesions or adenopathy.
2. No findings for inflammatory bowel process.
3. Scattered cystic air spaces are noted at the lung bases.

## 2020-12-15 ENCOUNTER — Encounter: Payer: Self-pay | Admitting: Internal Medicine

## 2021-10-22 ENCOUNTER — Ambulatory Visit: Payer: PRIVATE HEALTH INSURANCE | Admitting: Internal Medicine

## 2021-11-04 ENCOUNTER — Encounter: Payer: Self-pay | Admitting: Internal Medicine

## 2021-11-04 ENCOUNTER — Ambulatory Visit (INDEPENDENT_AMBULATORY_CARE_PROVIDER_SITE_OTHER): Payer: PRIVATE HEALTH INSURANCE | Admitting: Internal Medicine

## 2021-11-04 VITALS — BP 137/83 | HR 63 | Ht 69.0 in | Wt 232.8 lb

## 2021-11-04 DIAGNOSIS — Z8249 Family history of ischemic heart disease and other diseases of the circulatory system: Secondary | ICD-10-CM | POA: Diagnosis not present

## 2021-11-04 DIAGNOSIS — K719 Toxic liver disease, unspecified: Secondary | ICD-10-CM | POA: Diagnosis not present

## 2021-11-04 DIAGNOSIS — E7800 Pure hypercholesterolemia, unspecified: Secondary | ICD-10-CM | POA: Diagnosis not present

## 2021-11-04 DIAGNOSIS — K760 Fatty (change of) liver, not elsewhere classified: Secondary | ICD-10-CM

## 2021-11-04 DIAGNOSIS — T466X5A Adverse effect of antihyperlipidemic and antiarteriosclerotic drugs, initial encounter: Secondary | ICD-10-CM

## 2021-11-04 NOTE — Progress Notes (Signed)
? ? ?LIPID CLINIC CONSULT NOTE ? ?Chief Complaint:  ?Manage dyslipidemia ? ?Primary Care Physician: ?Bobby Small, MD ? ?Primary Cardiologist:  ?None ? ?HPI:  ?Bobby Lloyd is a 56 y.o. male who is being seen today for the evaluation of dyslipidemia at the request of Bobby Small, MD. this is a pleasant 56 year old male kindly referred for evaluation management of dyslipidemia.  He works as a Dance movement psychotherapist.  He does report family history of heart disease including his mother who had a stent in her 85s and his father who died but had three-vessel bypass in his 57s.  He has a history of high cholesterol most recently lipids show total cholesterol 278 triglycerides 196 HDL 30 and LDL 210.  Findings are of course suggestive of possible familial hyperlipidemia.  He tells me at one time however his cholesterol was much lower.  This was also associated with a lower weight and feels that weight gain and less activity are mostly responsible for this.  Other risk factors include type 2 diabetes with some nephropathy however his A1c did reverse with diet and weight control.  He is also listed as having stage III chronic kidney disease but his creatinine recently was reasonably normal.  He has known fatty liver with elevated liver enzymes and previously see Dr. May God for this.  He was advised to stop the statin because of elevated liver enzymes and his last ALT in November 2022 was 112.  He has not had repeat lipid testing since then.  He is currently physically active and exercises with the son at the gym.  He denies any shortness of breath or chest pain with exertion. ? ?PMHx:  ?Past Medical History:  ?Diagnosis Date  ? Allergic   ? SEASONAL  ? Depression   ? ANXIETY  ? Diabetes (HCC) 2015  ? FH: colonic polyps   ? Hematuria   ? Hypercholesteremia   ? IBS (irritable bowel syndrome)   ? ? ?No past surgical history on file. ? ?FAMHx:  ?Family History  ?Problem Relation Age of Onset  ? CAD Mother   ? CAD  Father   ? ? ?SOCHx:  ? reports that he has never smoked. He does not have any smokeless tobacco history on file. He reports current alcohol use of about 1.0 - 2.0 standard drink per week. No history on file for drug use. ? ?ALLERGIES:  ?Allergies  ?Allergen Reactions  ? Lipitor [Atorvastatin] Other (See Comments)  ?  MYALGIA ?  ? Rosuvastatin   ?  Liver problems ?  ? Tamiflu [Oseltamivir Phosphate] Nausea Only  ? ? ?ROS: ?Pertinent items noted in HPI and remainder of comprehensive ROS otherwise negative. ? ?HOME MEDS: ?Current Outpatient Medications on File Prior to Visit  ?Medication Sig Dispense Refill  ? albuterol (PROVENTIL HFA;VENTOLIN HFA) 108 (90 BASE) MCG/ACT inhaler Inhale 2 puffs into the lungs every 4 (four) hours as needed for wheezing or shortness of breath.    ? DULoxetine (CYMBALTA) 30 MG capsule Take 30 mg by mouth daily.    ? fluticasone (FLOVENT HFA) 110 MCG/ACT inhaler 2 puffs    ? loratadine (CLARITIN) 10 MG tablet 1 tablet    ? melatonin 3 MG TABS tablet Take 3 mg by mouth at bedtime.    ? Naproxen Sodium (ALEVE) 220 MG CAPS Take by mouth.    ? pantoprazole (PROTONIX) 40 MG tablet Take 40 mg by mouth daily.    ? rosuvastatin (CRESTOR) 5 MG tablet Take 5 mg by  mouth at bedtime.  (Patient not taking: Reported on 11/04/2021)    ? ?No current facility-administered medications on file prior to visit.  ? ? ?LABS/IMAGING: ?No results found for this or any previous visit (from the past 48 hour(s)). ?No results found. ? ?LIPID PANEL: ?No results found for: CHOL, TRIG, HDL, CHOLHDL, VLDL, LDLCALC, LDLDIRECT ? ?WEIGHTS: ?Wt Readings from Last 3 Encounters:  ?11/04/21 232 lb 12.8 oz (105.6 kg)  ?07/15/15 220 lb (99.8 kg)  ?07/08/14 231 lb 9.6 oz (105.1 kg)  ? ? ?VITALS: ?BP 137/83   Pulse 63   Ht 5\' 9"  (1.753 m)   Wt 232 lb 12.8 oz (105.6 kg)   SpO2 96%   BMI 34.38 kg/m?  ? ?EXAM: ?General appearance: alert and no distress ?Neck: no carotid bruit, no JVD, and thyroid not enlarged, symmetric, no  tenderness/mass/nodules ?Lungs: clear to auscultation bilaterally ?Heart: regular rate and rhythm, S1, S2 normal, no murmur, click, rub or gallop ?Abdomen: soft, non-tender; bowel sounds normal; no masses,  no organomegaly ?Extremities: extremities normal, atraumatic, no cyanosis or edema ?Pulses: 2+ and symmetric ?Skin: Skin color, texture, turgor normal. No rashes or lesions ?Neurologic: Grossly normal ?Psych: Pleasant ? ?EKG: ?Deferred ? 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 ?PLAN: ?1.   Mr. has a probable familial hyperlipidemia with LDL greater than 190.  He will need aggressive therapy to lower his cholesterol.  He has previously tried statins but had issues with elevated liver enzymes.  His most recent ALT was 112, not on any statin therapy.  He has had imaging evidence of fatty liver.  I would like to reassess his lipids including an NMR and LP(a) as well as a metabolic profile to evaluate his renal function and liver enzymes now prior to considering therapeutic options.  We might consider a less hepatically metabolized statin such as Livalo, or pursue PCSK9 inhibitor which would probably be the best option and the safest for his liver.  We will go ahead and get a calcium score to further conceptualize his risk and plan treatment strategies based on that. ? ?Thanks again for the kind referral. ? ?Bobby Layman, MD, [facility], FACP  ?[place]  [facility]  ?Medical Director of the Advanced Lipid Disorders &  ?Cardiovascular [facility] Reduction Clinic ?Diplomate of the [facility] of Clinical Lipidology ?Attending Cardiologist  ?Direct Dial: [phone]  Fax: [phone]  ?Website:  [email or web address] ? ?[phone] Bobby Lloyd ?11/04/2021, 12:55 PM ?

## 2021-11-04 NOTE — Patient Instructions (Signed)
Medication Instructions:  ?NO CHANGES today -- will depend on lab results and calcium score ? ?*If you need a refill on your cardiac medications before your next appointment, please call your pharmacy* ? ? ?Lab Work: ?NMR lipoprofile, LP(a), CMET ? ?If you have labs (blood work) drawn today and your tests are completely normal, you will receive your results only by: ?MyChart Message (if you have MyChart) OR ?A paper copy in the mail ?If you have any lab test that is abnormal or we need to change your treatment, we will call you to review the results. ? ? ?Testing/Procedures: ?Dr. Rennis Golden has ordered a CT coronary calcium score.  ? ?Test locations:  ?[facility] ([address] 3rd Floor [place], [place] [address]) ?[facility] ([address] [place], [place] [address])  ? ?This is $99 out of pocket. ? ? ?Coronary CalciumScan ?A coronary calcium scan is an imaging test used to look for deposits of calcium and other fatty materials (plaques) in the inner lining of the blood vessels of the heart (coronary arteries). These deposits of calcium and plaques can partly clog and narrow the coronary arteries without producing any symptoms or warning signs. This puts a person at risk for a heart attack. This test can detect these deposits before symptoms develop. ?Tell a health care provider about: ?Any allergies you have. ?All medicines you are taking, including vitamins, herbs, eye drops, creams, and over-the-counter medicines. ?Any problems you or family members have had with anesthetic medicines. ?Any blood disorders you have. ?Any surgeries you have had. ?Any medical conditions you have. ?Whether you are pregnant or may be pregnant. ?What are the risks? ?Generally, this is a safe procedure. However, problems may occur, including: ?Harm to a pregnant woman and her unborn baby. This test involves the use of radiation. Radiation exposure can be dangerous to a pregnant woman and her unborn baby. If you are pregnant,  you generally should not have this procedure done. ?Slight increase in the risk of cancer. This is because of the radiation involved in the test. ?What happens before the procedure? ?No preparation is needed for this procedure. ?What happens during the procedure? ?You will undress and remove any jewelry around your neck or chest. ?You will put on a hospital gown. ?Sticky electrodes will be placed on your chest. The electrodes will be connected to an electrocardiogram (ECG) machine to record a tracing of the electrical activity of your heart. ?A CT scanner will take pictures of your heart. During this time, you will be asked to lie still and hold your breath for 2-3 seconds while a picture of your heart is being taken. ?The procedure may vary among health care providers and hospitals. ?What happens after the procedure? ?You can get dressed. ?You can return to your normal activities. ?It is up to you to get the results of your test. Ask your health care provider, or the department that is doing the test, when your results will be ready. ?Summary ?A coronary calcium scan is an imaging test used to look for deposits of calcium and other fatty materials (plaques) in the inner lining of the blood vessels of the heart (coronary arteries). ?Generally, this is a safe procedure. Tell your health care provider if you are pregnant or may be pregnant. ?No preparation is needed for this procedure. ?A CT scanner will take pictures of your heart. ?You can return to your normal activities after the scan is done. ?This information is not intended to replace advice given to  you by your health care provider. Make sure you discuss any questions you have with your health care provider. ?Document Released: 12/31/2007 Document Revised: 05/23/2016 Document Reviewed: 05/23/2016 ?Elsevier Interactive Patient Education ? 2017 Elsevier Inc. ? ? ? ?Follow-Up: ?At [facility], you and your health needs are our priority.  As part of our  continuing mission to provide you with exceptional heart care, we have created designated Provider Care Teams.  These Care Teams include your primary Cardiologist (physician) and Advanced Practice Providers (APPs -  Physician Assistants and Nurse Practitioners) who all work together to provide you with the care you need, when you need it. ? ?We recommend signing up for the patient portal called "MyChart".  Sign up information is provided on this After Visit Summary.  MyChart is used to connect with patients for Virtual Visits (Telemedicine).  Patients are able to view lab/test results, encounter notes, upcoming appointments, etc.  Non-urgent messages can be sent to your provider as well.   ?To learn more about what you can do with MyChart, go to [email or web address].   ? ?Your next appointment:   ?4-5 months with Dr. Rennis Golden  ? ? ? ? ?

## 2021-11-05 LAB — COMPREHENSIVE METABOLIC PANEL
ALT: 101 IU/L — ABNORMAL HIGH (ref 0–44)
AST: 50 IU/L — ABNORMAL HIGH (ref 0–40)
Albumin/Globulin Ratio: 2.2 (ref 1.2–2.2)
Albumin: 4.8 g/dL (ref 3.8–4.9)
Alkaline Phosphatase: 115 IU/L (ref 44–121)
BUN/Creatinine Ratio: 10 (ref 9–20)
BUN: 12 mg/dL (ref 6–24)
Bilirubin Total: 0.5 mg/dL (ref 0.0–1.2)
CO2: 22 mmol/L (ref 20–29)
Calcium: 9.5 mg/dL (ref 8.7–10.2)
Chloride: 101 mmol/L (ref 96–106)
Creatinine, Ser: 1.25 mg/dL (ref 0.76–1.27)
Globulin, Total: 2.2 g/dL (ref 1.5–4.5)
Glucose: 171 mg/dL — ABNORMAL HIGH (ref 70–99)
Potassium: 4.5 mmol/L (ref 3.5–5.2)
Sodium: 138 mmol/L (ref 134–144)
Total Protein: 7 g/dL (ref 6.0–8.5)
eGFR: 68 mL/min/{1.73_m2} (ref >59)

## 2021-11-05 LAB — NMR, LIPOPROFILE
Cholesterol, Total: 271 mg/dL — ABNORMAL HIGH (ref 100–199)
HDL Particle Number: 21.6 umol/L — ABNORMAL LOW (ref >=30.5)
HDL-C: 28 mg/dL — ABNORMAL LOW (ref >39)
LDL Particle Number: 2748 nmol/L — ABNORMAL HIGH (ref <1000)
LDL Size: 20.4 nm — ABNORMAL LOW (ref >20.5)
LDL-C (NIH Calc): 202 mg/dL — ABNORMAL HIGH (ref 0–99)
LP-IR Score: 84 — ABNORMAL HIGH (ref <=45)
Small LDL Particle Number: 1575 nmol/L — ABNORMAL HIGH (ref <=527)
Triglycerides: 210 mg/dL — ABNORMAL HIGH (ref 0–149)

## 2021-11-05 LAB — LIPOPROTEIN A (LPA): Lipoprotein (a): 11.2 nmol/L (ref <75.0)

## 2021-11-09 ENCOUNTER — Telehealth: Payer: PRIVATE HEALTH INSURANCE | Admitting: Internal Medicine

## 2021-11-09 DIAGNOSIS — E7800 Pure hypercholesterolemia, unspecified: Secondary | ICD-10-CM

## 2021-11-09 DIAGNOSIS — K760 Fatty (change of) liver, not elsewhere classified: Secondary | ICD-10-CM

## 2021-11-09 NOTE — Telephone Encounter (Signed)
Spoke with patient about lab results. He is in agreement to start PCSK9i. Explained medication, lab work needed. Advised will get PA and notify him when med is approved and how to obtain co-pay card. He voiced understanding.  ? ?Lab order(s) mailed ?

## 2021-11-09 NOTE — Telephone Encounter (Signed)
Patient returning call for lab results. 

## 2021-11-09 NOTE — Telephone Encounter (Signed)
Bobby Nose, MD  ?11/06/2021 12:45 PM EDT   ?  ?AST/ALT remain elevated - particle testing shows very high cholesterol - calcium score remains pending. With LDL >190 suggestive of FH, would pursue PCSK9i which will be the most effective and best tolerated on the liver - should re-check liver enzymes 1-2 weeks after 1st dose, prior to second dose of PCSK9i. Calcium score is pending. ?  ?Dr Rexene Edison  ? ?

## 2021-11-11 NOTE — Telephone Encounter (Signed)
PA submitted via [email or web address] ?Prior Auth (EOC) ID: [id number] ?

## 2021-11-15 MED ORDER — REPATHA SURECLICK 140 MG/ML ~~LOC~~ SOAJ: 1.0000 | SUBCUTANEOUS | 3 refills | Status: DC

## 2021-11-15 NOTE — Telephone Encounter (Signed)
Patient notified that med is approved ?Rx(s) sent to pharmacy electronically. ?Advised him home to obtain a co-pay card ?

## 2021-11-15 NOTE — Telephone Encounter (Signed)
Drug/Service Name: REPATHA [id number] MG/ML [place]  ?Physician/Nurse: Chrissie Noa HILTY ?EOC ID:  [id number]  ?Status:   Approved ?Date Requested: 11/11/2021 09:02:17  ?Date Closed:  11/12/2021 13:41:05 ?

## 2021-12-03 LAB — HEPATIC FUNCTION PANEL
ALT: 95 IU/L — ABNORMAL HIGH (ref 0–44)
AST: 44 IU/L — ABNORMAL HIGH (ref 0–40)
Albumin: 4.7 g/dL (ref 3.8–4.9)
Alkaline Phosphatase: 110 IU/L (ref 44–121)
Bilirubin Total: 0.4 mg/dL (ref 0.0–1.2)
Bilirubin, Direct: 0.13 mg/dL (ref 0.00–0.40)
Total Protein: 7 g/dL (ref 6.0–8.5)

## 2021-12-15 ENCOUNTER — Encounter: Payer: Self-pay | Admitting: Internal Medicine

## 2021-12-21 ENCOUNTER — Inpatient Hospital Stay: Admission: RE | Admit: 2021-12-21 | Payer: PRIVATE HEALTH INSURANCE | Source: Ambulatory Visit

## 2022-01-04 ENCOUNTER — Inpatient Hospital Stay: Admission: RE | Admit: 2022-01-04 | Payer: PRIVATE HEALTH INSURANCE | Source: Ambulatory Visit

## 2022-01-08 ENCOUNTER — Telehealth (HOSPITAL_BASED_OUTPATIENT_CLINIC_OR_DEPARTMENT_OTHER): Payer: Self-pay

## 2022-02-15 ENCOUNTER — Ambulatory Visit (HOSPITAL_BASED_OUTPATIENT_CLINIC_OR_DEPARTMENT_OTHER)
Admission: RE | Admit: 2022-02-15 | Discharge: 2022-02-15 | Disposition: A | Discharge status: Home / Self Care | Payer: Self-pay | Source: Ambulatory Visit | Attending: Internal Medicine | Admitting: Internal Medicine

## 2022-02-15 DIAGNOSIS — E7800 Pure hypercholesterolemia, unspecified: Secondary | ICD-10-CM | POA: Insufficient documentation

## 2022-03-22 ENCOUNTER — Ambulatory Visit (HOSPITAL_BASED_OUTPATIENT_CLINIC_OR_DEPARTMENT_OTHER): Payer: PRIVATE HEALTH INSURANCE | Admitting: Internal Medicine

## 2022-06-03 ENCOUNTER — Other Ambulatory Visit: Payer: Self-pay

## 2022-06-03 ENCOUNTER — Telehealth: Payer: Self-pay | Admitting: Internal Medicine

## 2022-06-03 DIAGNOSIS — E7800 Pure hypercholesterolemia, unspecified: Secondary | ICD-10-CM

## 2022-06-03 NOTE — Telephone Encounter (Signed)
Patient stated he did not get blood work back in August and has an appointment with Dr. Rennis Golden on 12/6. Order for NMR lipoprofile ordered for patient for him to get 1 week before appointment. He stated he will come on 11/28 for fasting blood work

## 2022-06-03 NOTE — Telephone Encounter (Signed)
Patient is requesting orders to have lab work prior to 12/06 appointment with Dr. Rennis Golden and he would like a call back to confirm when placed.

## 2022-06-16 LAB — NMR, LIPOPROFILE
Cholesterol, Total: 150 mg/dL (ref 100–199)
HDL Particle Number: 31.6 umol/L (ref >=30.5)
HDL-C: 35 mg/dL — ABNORMAL LOW (ref >39)
LDL Particle Number: 1152 nmol/L — ABNORMAL HIGH (ref <1000)
LDL Size: 20.3 nm — ABNORMAL LOW (ref >20.5)
LDL-C (NIH Calc): 81 mg/dL (ref 0–99)
LP-IR Score: 90 — ABNORMAL HIGH (ref <=45)
Small LDL Particle Number: 661 nmol/L — ABNORMAL HIGH (ref <=527)
Triglycerides: 202 mg/dL — ABNORMAL HIGH (ref 0–149)

## 2022-06-20 ENCOUNTER — Telehealth: Payer: Self-pay | Admitting: Pharmacist

## 2022-06-20 MED ORDER — REPATHA SURECLICK 140 MG/ML ~~LOC~~ SOAJ: 1.0000 | SUBCUTANEOUS | 3 refills | Status: DC

## 2022-06-20 NOTE — Telephone Encounter (Addendum)
Prior auth approved through 06/20/23.

## 2022-06-20 NOTE — Telephone Encounter (Signed)
Repatha prior auth submitted on [email or web address], prior auth ID [id number]

## 2022-06-22 ENCOUNTER — Encounter (HOSPITAL_BASED_OUTPATIENT_CLINIC_OR_DEPARTMENT_OTHER): Payer: Self-pay | Admitting: Internal Medicine

## 2022-06-22 ENCOUNTER — Ambulatory Visit (INDEPENDENT_AMBULATORY_CARE_PROVIDER_SITE_OTHER): Payer: PRIVATE HEALTH INSURANCE | Admitting: Internal Medicine

## 2022-06-22 VITALS — BP 112/78 | HR 65 | Ht 69.0 in | Wt 227.0 lb

## 2022-06-22 DIAGNOSIS — E7800 Pure hypercholesterolemia, unspecified: Secondary | ICD-10-CM

## 2022-06-22 DIAGNOSIS — K760 Fatty (change of) liver, not elsewhere classified: Secondary | ICD-10-CM | POA: Diagnosis not present

## 2022-06-22 NOTE — Progress Notes (Signed)
LIPID CLINIC CONSULT NOTE  Chief Complaint:  Manage dyslipidemia  Primary Care Physician: Maurice Small, MD  Primary Cardiologist:  None  HPI:  Bobby Lloyd is a 56 y.o. male who is being seen today for the evaluation of dyslipidemia at the request of Maurice Small, MD. this is a pleasant 56 year old male kindly referred for evaluation management of dyslipidemia.  He works as a Dance movement psychotherapist.  He does report family history of heart disease including his mother who had a stent in her 73s and his father who died but had three-vessel bypass in his 48s.  He has a history of high cholesterol most recently lipids show total cholesterol 278 triglycerides 196 HDL 30 and LDL 210.  Findings are of course suggestive of possible familial hyperlipidemia.  He tells me at one time however his cholesterol was much lower.  This was also associated with a lower weight and feels that weight gain and less activity are mostly responsible for this.  Other risk factors include type 2 diabetes with some nephropathy however his A1c did reverse with diet and weight control.  He is also listed as having stage III chronic kidney disease but his creatinine recently was reasonably normal.  He has known fatty liver with elevated liver enzymes and previously Lloyd Dr. May God for this.  He was advised to stop the statin because of elevated liver enzymes and his last ALT in November 2022 was 112.  He has not had repeat lipid testing since then.  He is currently physically active and exercises with the son at the gym.  He denies any shortness of breath or chest pain with exertion.  06/22/2022  [name] returns today for follow-up.  He has done well over the past year on Repatha.  He is LDL particle number has come down from [id number].  LDL-C now 81, down from 202.  HDL-C is 35, up from 28.  His small LDL particle number is 661, down from 1575.  Overall a significant improvement.  He tells me recently he has also  been started on Ozempic.  He is hopeful that this will be associated with more weight loss which I suspect will help with his lipids as well.  PMHx:  Past Medical History:  Diagnosis Date   Allergic    SEASONAL   Depression    ANXIETY   Diabetes (HCC) 2015   FH: colonic polyps    Hematuria    Hypercholesteremia    IBS (irritable bowel syndrome)     No past surgical history on file.  FAMHx:  Family History  Problem Relation Age of Onset   CAD Mother    CAD Father     SOCHx:   reports that he has never smoked. He does not have any smokeless tobacco history on file. He reports current alcohol use of about 1.0 - 2.0 standard drink of alcohol per week. No history on file for drug use.  ALLERGIES:  Allergies  Allergen Reactions   Lipitor [Atorvastatin] Other (Lloyd Comments)    MYALGIA    Rosuvastatin     Liver problems    Tamiflu [Oseltamivir Phosphate] Nausea Only    ROS: Pertinent items noted in HPI and remainder of comprehensive ROS otherwise negative.  HOME MEDS: Current Outpatient Medications on File Prior to Visit  Medication Sig Dispense Refill   albuterol (PROVENTIL HFA;VENTOLIN HFA) 108 (90 BASE) MCG/ACT inhaler Inhale 2 puffs into the lungs every 4 (four) hours as needed for wheezing or shortness  of breath.     DULoxetine (CYMBALTA) 30 MG capsule Take 30 mg by mouth daily.     melatonin 3 MG TABS tablet Take 3 mg by mouth at bedtime.     metFORMIN (GLUCOPHAGE-XR) 500 MG 24 hr tablet Take 500 mg by mouth daily.     Naproxen Sodium (ALEVE) 220 MG CAPS Take by mouth.     pantoprazole (PROTONIX) 40 MG tablet Take 40 mg by mouth daily.     Evolocumab (REPATHA SURECLICK) 140 MG/ML SOAJ Inject 140 mg into the skin every 14 (fourteen) days. (Patient not taking: Reported on 06/22/2022) 6 mL 3   OZEMPIC, 0.25 OR 0.5 MG/DOSE, 2 MG/3ML SOPN Inject into the skin. (Patient not taking: Reported on 06/22/2022)     No current facility-administered medications on file prior to  visit.    LABS/IMAGING: No results found for this or any previous visit (from the past 48 hour(s)). No results found.  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   Mr. had significant improvement in his lipids on Repatha.  He is tolerating the medication well.  He does have a history of fatty liver with elevated liver enzymes.  His ALT has been around 100 and recently was up to 120.  I do not feel that this is significantly different but needs to be monitored on therapy.  Would advise a repeat liver enzyme test in about 3 months and recheck of his lipids in 6 months.  Follow-up with me afterwards.  Mardella Layman, MD, [facility], FACP  [facility]  Medical Director of the Advanced Lipid Disorders &  Cardiovascular Risk Reduction Clinic Diplomate of the American Board of Clinical Lipidology Attending Cardiologist  Direct Dial: [phone]  Fax: [phone]  Website:  [email or web address] Kyl Givler 06/22/2022, 1:37 PM

## 2022-06-22 NOTE — Patient Instructions (Signed)
Medication Instructions:  NO CHANGES  *If you need a refill on your cardiac medications before your next appointment, please call your pharmacy*   Lab Work: Non-Fasting Liver Function Test in 3 months  Fasting NMR Lipoprofile in 6 months to check cholesterol   If you have labs (blood work) drawn today and your tests are completely normal, you will receive your results only by: MyChart Message (if you have MyChart) OR A paper copy in the mail If you have any lab test that is abnormal or we need to change your treatment, we will call you to review the results.   Testing/Procedures: NONE   Follow-Up: At [facility], you and your health needs are our priority.  As part of our continuing mission to provide you with exceptional heart care, we have created designated Provider Care Teams.  These Care Teams include your primary Cardiologist (physician) and Advanced Practice Providers (APPs -  Physician Assistants and Nurse Practitioners) who all work together to provide you with the care you need, when you need it.  We recommend signing up for the patient portal called "MyChart".  Sign up information is provided on this After Visit Summary.  MyChart is used to connect with patients for Virtual Visits (Telemedicine).  Patients are able to view lab/test results, encounter notes, upcoming appointments, etc.  Non-urgent messages can be sent to your provider as well.   To learn more about what you can do with MyChart, go to [email or web address].    Your next appointment:   6 month(s)  The format for your next appointment:   In Person  Provider:   Zoila Shutter MD

## 2022-11-18 ENCOUNTER — Other Ambulatory Visit: Payer: Self-pay | Admitting: Pharmacist

## 2022-11-18 MED ORDER — REPATHA SURECLICK 140 MG/ML ~~LOC~~ SOAJ: 1.0000 | SUBCUTANEOUS | 3 refills | Status: DC

## 2022-11-21 ENCOUNTER — Other Ambulatory Visit: Payer: Self-pay | Admitting: *Deleted

## 2022-11-21 DIAGNOSIS — E7800 Pure hypercholesterolemia, unspecified: Secondary | ICD-10-CM

## 2022-11-21 DIAGNOSIS — K760 Fatty (change of) liver, not elsewhere classified: Secondary | ICD-10-CM

## 2022-12-13 ENCOUNTER — Telehealth: Payer: Self-pay | Admitting: Internal Medicine

## 2022-12-13 NOTE — Telephone Encounter (Signed)
Spoke with patient and advised lab already ordered. He states will have done a week prior to appt.

## 2022-12-13 NOTE — Telephone Encounter (Signed)
Patient is requesting orders for fasting labs prior to 10/01 appointment with Dr. Rennis Golden.

## 2023-03-24 ENCOUNTER — Other Ambulatory Visit (HOSPITAL_COMMUNITY): Payer: Self-pay

## 2023-03-28 NOTE — Progress Notes (Unsigned)
Cardiology Office Note:  .   Date:  03/29/2023  ID:  [name], DOB 06/24/1966, MRN [id number] PCP: Maurice Small, MD (Inactive)  [facility] Providers Cardiologist:  None { Click to update primary MD,subspecialty MD or APP then REFRESH:1}   Patient Profile: .      PMH Dyslipidemia with LDL > 190 Family history early CAD Mother - angioplasty age 56 Father - CABG x 3 age 73 Sisters - hypertension Obesity Hepatic steatosis Elevated liver enzymes Type 2 DM  Referred to lipid clinic and seen by Dr. Rennis Golden for hyperlipidemia. He works as a Dance movement psychotherapist.  Bobby Lloyd history of heart disease including his mother who had a stent in her 38s and father who died while having three-vessel bypass in his 30s.  At time of initial visit, total cholesterol 278, triglycerides 196, HDL 30, and LDL 210 findings suggestive of familial hyperlipidemia.  Reported lower cholesterol when weight was down but admitted to recent weight gain and less activity.  Had significant improvement in A1c with diet and weight loss.  He is listed as having stage III CKD but creatinine was normal at time of visit.  Known fatty liver with elevated liver enzymes followed by GI.  Was started on Repatha with improvement in LDL particle number from [id number].  LDL-C improved to 81 down from 202 and HDL C improved to 35 up from 28.  LDL particle number 661 down from 1575.  He had been started on Ozempic at office visit 06/22/2022. He was advised to return in 6 months for liver and lipid panel.        History of Present Illness: .   Bobby Lloyd is a very pleasant 57 y.o. male who is here alone today for follow-up. Reports he has been working out consistently for 6-7 months 6 days per week with weight lifting and aerobic exercise  02/26/23 - felt wiped out after a work out and mowing grass Tried to go gym the next day, very fatigued Took a week off, went back and had a great workout The next day, he was dizzy. Has  been seen by PCP and underwent extensive lab testing  Discomfort in lower back BP elevated at home 140s over 80s-90s  ROS: ***       Studies Reviewed: Marland Kitchen   EKG Interpretation Date/Time:  Wednesday March 29 2023 15:45:39 EDT Ventricular Rate:  63 PR Interval:  186 QRS Duration:  86 QT Interval:  404 QTC Calculation: 413 R Axis:   22  Text Interpretation: Normal sinus rhythm Normal ECG No previous ECGs available No ST abnormality Confirmed by Eligha Bridegroom [phone]) on 03/29/2023 3:47:26 PM    *** Risk Assessment/Calculations:   {Does this patient have ATRIAL FIBRILLATION?:[phone]}         Physical Exam:   VS:  BP 120/86   Pulse 76   Ht 5\' 9"  (1.753 m)   Wt 224 lb (101.6 kg)   BMI 33.08 kg/m    Wt Readings from Last 3 Encounters:  03/29/23 224 lb (101.6 kg)  06/22/22 227 lb (103 kg)  11/04/21 232 lb 12.8 oz (105.6 kg)    GEN: Well nourished, well developed in no acute distress NECK: No JVD; No carotid bruits CARDIAC: ***RRR, no murmurs, rubs, gallops RESPIRATORY:  Clear to auscultation without rales, wheezing or rhonchi  ABDOMEN: Soft, non-tender, non-distended EXTREMITIES:  No edema; No deformity     ASSESSMENT AND PLAN: .    Dyslipidemia:     {Are  you ordering a CV Procedure (e.g. stress test, cath, DCCV, TEE, etc)?   Press F2        :[id number]}  Dispo: ***  Signed, Eligha Bridegroom, NP-C

## 2023-03-29 ENCOUNTER — Ambulatory Visit (HOSPITAL_BASED_OUTPATIENT_CLINIC_OR_DEPARTMENT_OTHER): Payer: PRIVATE HEALTH INSURANCE | Admitting: Nurse Practitioner

## 2023-03-29 ENCOUNTER — Encounter (HOSPITAL_BASED_OUTPATIENT_CLINIC_OR_DEPARTMENT_OTHER): Payer: Self-pay | Admitting: Nurse Practitioner

## 2023-03-29 VITALS — BP 120/86 | HR 76 | Ht 69.0 in | Wt 224.0 lb

## 2023-03-29 DIAGNOSIS — I25118 Atherosclerotic heart disease of native coronary artery with other forms of angina pectoris: Secondary | ICD-10-CM

## 2023-03-29 DIAGNOSIS — R5383 Other fatigue: Secondary | ICD-10-CM | POA: Diagnosis not present

## 2023-03-29 DIAGNOSIS — E785 Hyperlipidemia, unspecified: Secondary | ICD-10-CM

## 2023-03-29 DIAGNOSIS — Z8249 Family history of ischemic heart disease and other diseases of the circulatory system: Secondary | ICD-10-CM | POA: Diagnosis not present

## 2023-03-29 DIAGNOSIS — R5382 Chronic fatigue, unspecified: Secondary | ICD-10-CM

## 2023-03-29 DIAGNOSIS — I209 Angina pectoris, unspecified: Secondary | ICD-10-CM

## 2023-03-29 MED ORDER — METOPROLOL TARTRATE 50 MG PO TABS: ORAL_TABLET | ORAL | 0 refills | Status: AC

## 2023-03-29 NOTE — Patient Instructions (Addendum)
Medication Instructions:   Your physician recommends that you continue on your current medications as directed. Please refer to the Current Medication list given to you today.   *If you need a refill on your cardiac medications before your next appointment, please call your pharmacy*   Lab Work:  Your physician recommends that you return for a FASTING NMR. Please go to [facility] location in a couple of weeks. You can come in on the day of your appointment anytime between 8:00-4:30 fasting from midnight the night before.  Not between 12-1.     If you have labs (blood work) drawn today and your tests are completely normal, you will receive your results only by: MyChart Message (if you have MyChart) OR A paper copy in the mail If you have any lab test that is abnormal or we need to change your treatment, we will call you to review the results.   Testing/Procedures:   Your cardiac CT will be scheduled at one of the below locations:   [facility] [address] [place], [place] [address] [phone]  If scheduled at [facility], please arrive at the [facility] and Children's Entrance (Entrance C2) of [facility] 30 minutes prior to test start time. You can use the FREE valet parking offered at entrance C (encouraged to control the heart rate for the test)  Proceed to the [facility] Radiology Department (first floor) to check-in and test prep.  All radiology patients and guests should use entrance C2 at [facility], accessed from [facility], even though the hospital's physical address listed is [address]     There is spacious parking and easy access to the radiology department from the [facility] Vascular entrance. Please enter here and check-in with the desk attendant.   Please follow these instructions carefully (unless otherwise directed):  An IV will be required for this test and Nitroglycerin will be given.   Hold all erectile dysfunction medications at least 3 days (72 hrs) prior to test. (Ie viagra, cialis, sildenafil, tadalafil, etc)   On the Night Before the Test: Be sure to Drink plenty of water. Do not consume any caffeinated/decaffeinated beverages or chocolate 12 hours prior to your test. Do not take any antihistamines 12 hours prior to your test.  On the Day of the Test: Drink plenty of water until 1 hour prior to the test. Do not eat any food 1 hour prior to test. You may take your regular medications prior to the test.  Take metoprolol (Lopressor) one tablet by mouth ( 50 mg) two hours prior to test.      After the Test: Drink plenty of water. After receiving IV contrast, you may experience a mild flushed feeling. This is normal. On occasion, you may experience a mild rash up to 24 hours after the test. This is not dangerous. If this occurs, you can take Benadryl 25 mg and increase your fluid intake. If you experience trouble breathing, this can be serious. If it is severe call 911 IMMEDIATELY. If it is mild, please call our office.  We will call to schedule your test 2-4 weeks out understanding that some insurance companies will need an authorization prior to the service being performed.   For more information and frequently asked questions, please visit our website : [email or web address]  For non-scheduling related questions, please contact the cardiac imaging nurse navigator should you have any questions/concerns: Cardiac Imaging Nurse Navigators Direct Office Dial:  [phone]   For scheduling needs, including cancellations and rescheduling, please call [place], [phone].    Follow-Up: At [facility], you and your health needs are our priority.  As part of our continuing mission to provide you with exceptional heart care, we have created designated Provider Care Teams.  These Care Teams include your primary Cardiologist (physician) and Advanced  Practice Providers (APPs -  Physician Assistants and Nurse Practitioners) who all work together to provide you with the care you need, when you need it.  We recommend signing up for the patient portal called "MyChart".  Sign up information is provided on this After Visit Summary.  MyChart is used to connect with patients for Virtual Visits (Telemedicine).  Patients are able to view lab/test results, encounter notes, upcoming appointments, etc.  Non-urgent messages can be sent to your provider as well.   To learn more about what you can do with MyChart, go to [email or web address].    Your next appointment:   1 month(s)  Provider:   K. [place] Hilty, MD

## 2023-03-30 ENCOUNTER — Encounter (HOSPITAL_BASED_OUTPATIENT_CLINIC_OR_DEPARTMENT_OTHER): Payer: Self-pay | Admitting: Nurse Practitioner

## 2023-03-30 ENCOUNTER — Encounter (HOSPITAL_BASED_OUTPATIENT_CLINIC_OR_DEPARTMENT_OTHER): Payer: Self-pay

## 2023-03-30 MED ORDER — ASPIRIN 81 MG PO TBEC: 81.0000 mg | DELAYED_RELEASE_TABLET | Freq: Every day | ORAL | Status: AC

## 2023-03-30 MED ORDER — NITROGLYCERIN 0.4 MG SL SUBL: 0.4000 mg | SUBLINGUAL_TABLET | SUBLINGUAL | 3 refills | Status: AC | PRN

## 2023-04-05 ENCOUNTER — Telehealth (HOSPITAL_COMMUNITY): Payer: Self-pay | Admitting: *Deleted

## 2023-04-05 NOTE — Telephone Encounter (Signed)
Reaching out to patient to offer assistance regarding upcoming cardiac imaging study; pt verbalizes understanding of appt date/time, parking situation and where to check in, pre-test NPO status and medications ordered, and verified current allergies; name and call back number provided for further questions should they arise Hayley Sharpe RN Navigator Cardiac [facility] [phone] office [phone] cell  

## 2023-04-06 ENCOUNTER — Ambulatory Visit (HOSPITAL_COMMUNITY)
Admission: RE | Admit: 2023-04-06 | Discharge: 2023-04-06 | Disposition: A | Discharge status: Home / Self Care | Payer: PRIVATE HEALTH INSURANCE | Source: Ambulatory Visit | Attending: Nurse Practitioner | Admitting: Nurse Practitioner

## 2023-04-06 ENCOUNTER — Encounter (HOSPITAL_BASED_OUTPATIENT_CLINIC_OR_DEPARTMENT_OTHER): Payer: Self-pay

## 2023-04-06 DIAGNOSIS — I25118 Atherosclerotic heart disease of native coronary artery with other forms of angina pectoris: Secondary | ICD-10-CM | POA: Diagnosis present

## 2023-04-06 DIAGNOSIS — R5383 Other fatigue: Secondary | ICD-10-CM

## 2023-04-06 DIAGNOSIS — Z8249 Family history of ischemic heart disease and other diseases of the circulatory system: Secondary | ICD-10-CM | POA: Diagnosis present

## 2023-04-06 MED ORDER — NITROGLYCERIN 0.4 MG SL SUBL
SUBLINGUAL_TABLET | SUBLINGUAL | Status: AC
  Filled 2023-04-06: qty 2

## 2023-04-06 MED ORDER — IOHEXOL 350 MG/ML SOLN
95.0000 mL | Freq: Once | INTRAVENOUS | Status: AC | PRN
  Administered 2023-04-06: 95 mL via INTRAVENOUS

## 2023-04-06 MED ORDER — NITROGLYCERIN 0.4 MG SL SUBL
0.8000 mg | SUBLINGUAL_TABLET | Freq: Once | SUBLINGUAL | Status: AC
  Administered 2023-04-06: 0.8 mg via SUBLINGUAL

## 2023-04-07 ENCOUNTER — Telehealth: Payer: Self-pay

## 2023-04-07 ENCOUNTER — Telehealth (HOSPITAL_BASED_OUTPATIENT_CLINIC_OR_DEPARTMENT_OTHER): Payer: Self-pay | Admitting: *Deleted

## 2023-04-07 ENCOUNTER — Other Ambulatory Visit (HOSPITAL_COMMUNITY): Payer: Self-pay

## 2023-04-07 NOTE — Telephone Encounter (Signed)
Pharmacy Patient Advocate Encounter  Received notification from  [facility]  that Prior Authorization for REPATHA has been APPROVED from 04/07/23 to 04/06/24. Ran test claim, Copay is $75 (SPOKE TO PT'S PHARMACY, THEY HAD 1 BOX IN STOCK AND ARE FILLING NOW (PHARM WILL CALL PT LATER TODAY FOR PICKUP), HIS COST WAS THE SAME WITH [employer] [facility] ($75 FOR 1 MONTH SUPPLY)). This test claim was processed through [facility]- copay amounts may vary at other pharmacies due to pharmacy/plan contracts, or as the patient moves through the different stages of their insurance plan.

## 2023-04-07 NOTE — Telephone Encounter (Addendum)
Pharmacy Patient Advocate Encounter   Received notification from Physician's Office that prior authorization for REPATHA is required/requested.   Insurance verification completed.   The patient is insured through  [facility]  .   Per test claim: PA required; PA submitted to [facility] via CoverMyMeds Key/confirmation #/EOC [id number] Status is pending

## 2023-04-07 NOTE — Telephone Encounter (Signed)
PA request has been Submitted. New Encounter created for follow up. For additional info see Pharmacy Prior Auth telephone encounter from 04/07/23.

## 2023-04-07 NOTE — Telephone Encounter (Signed)
Received below message from patient regarding PA for Repatha  Also, because my insurance changed (not carriers) but number. You all have the correct one. The pharmacy needs you all to call them and send in paper work so insurance will cover for free as before. I haven't had Repatha in a month due to this issue. Pharmacy said initially the reached out to you all but this week I asked and they said they hadn't and need you all to reach out. Sorry for the inconvenience. Thanks for all you do. I use the [facility] pharmacy on [address].   Will route to Rx PA team to initiate

## 2023-04-07 NOTE — Telephone Encounter (Signed)
Approved, see phone note 9/20

## 2023-04-14 ENCOUNTER — Encounter (HOSPITAL_BASED_OUTPATIENT_CLINIC_OR_DEPARTMENT_OTHER): Payer: Self-pay | Admitting: Internal Medicine

## 2023-04-14 LAB — NMR, LIPOPROFILE
Cholesterol, Total: 260 mg/dL — ABNORMAL HIGH (ref 100–199)
HDL Particle Number: 24.6 umol/L — ABNORMAL LOW (ref >=30.5)
HDL-C: 32 mg/dL — ABNORMAL LOW (ref >39)
LDL Particle Number: 2525 nmol/L — ABNORMAL HIGH (ref <1000)
LDL Size: 20.4 nmol — ABNORMAL LOW (ref >20.5)
LDL-C (NIH Calc): 188 mg/dL — ABNORMAL HIGH (ref 0–99)
LP-IR Score: 91 — ABNORMAL HIGH (ref <=45)
Small LDL Particle Number: 1583 nmol/L — ABNORMAL HIGH (ref <=527)
Triglycerides: 206 mg/dL — ABNORMAL HIGH (ref 0–149)

## 2023-04-14 LAB — HEPATIC FUNCTION PANEL
ALT: 48 [IU]/L — ABNORMAL HIGH (ref 0–44)
AST: 29 [IU]/L (ref 0–40)
Albumin: 4.9 g/dL (ref 3.8–4.9)
Alkaline Phosphatase: 104 [IU]/L (ref 44–121)
Bilirubin Total: 0.6 mg/dL (ref 0.0–1.2)
Bilirubin, Direct: 0.14 mg/dL (ref 0.00–0.40)
Total Protein: 7.7 g/dL (ref 6.0–8.5)

## 2023-04-18 ENCOUNTER — Encounter (HOSPITAL_BASED_OUTPATIENT_CLINIC_OR_DEPARTMENT_OTHER): Payer: Self-pay | Admitting: Internal Medicine

## 2023-04-18 ENCOUNTER — Ambulatory Visit (HOSPITAL_BASED_OUTPATIENT_CLINIC_OR_DEPARTMENT_OTHER): Payer: PRIVATE HEALTH INSURANCE | Admitting: Internal Medicine

## 2023-04-18 VITALS — BP 126/82 | HR 70 | Ht 69.0 in | Wt 227.0 lb

## 2023-04-18 DIAGNOSIS — T466X5D Adverse effect of antihyperlipidemic and antiarteriosclerotic drugs, subsequent encounter: Secondary | ICD-10-CM

## 2023-04-18 DIAGNOSIS — Z5181 Encounter for therapeutic drug level monitoring: Secondary | ICD-10-CM

## 2023-04-18 DIAGNOSIS — K719 Toxic liver disease, unspecified: Secondary | ICD-10-CM | POA: Diagnosis not present

## 2023-04-18 DIAGNOSIS — E785 Hyperlipidemia, unspecified: Secondary | ICD-10-CM | POA: Diagnosis not present

## 2023-04-18 DIAGNOSIS — I25118 Atherosclerotic heart disease of native coronary artery with other forms of angina pectoris: Secondary | ICD-10-CM | POA: Diagnosis not present

## 2023-04-18 NOTE — Patient Instructions (Addendum)
Medication Instructions:  Your physician recommends that you continue on your current medications as directed. Please refer to the Current Medication list given to you today.   *If you need a refill on your cardiac medications before your next appointment, please call your pharmacy*  Lab Work: FASTING CMET/NMR IN 6 MONTHS ABOUT A WEEK PRIOR TO FOLLOW UP   If you have labs (blood work) drawn today and your tests are completely normal, you will receive your results only by: MyChart Message (if you have MyChart) OR A paper copy in the mail If you have any lab test that is abnormal or we need to change your treatment, we will call you to review the results.  Testing/Procedures: NONE   Follow-Up: At [facility], you and your health needs are our priority.  As part of our continuing mission to provide you with exceptional heart care, we have created designated Provider Care Teams.  These Care Teams include your primary Cardiologist (physician) and Advanced Practice Providers (APPs -  Physician Assistants and Nurse Practitioners) who all work together to provide you with the care you need, when you need it.  We recommend signing up for the patient portal called "MyChart".  Sign up information is provided on this After Visit Summary.  MyChart is used to connect with patients for Virtual Visits (Telemedicine).  Patients are able to view lab/test results, encounter notes, upcoming appointments, etc.  Non-urgent messages can be sent to your provider as well.   To learn more about what you can do with MyChart, go to [email or web address].    Your next appointment:   6 month(s)  Provider:   K. Italy Hilty, MD

## 2023-04-18 NOTE — Progress Notes (Signed)
LIPID CLINIC CONSULT NOTE  Chief Complaint:  Manage dyslipidemia  Primary Care Physician: Ollen Bowl, MD  Primary Cardiologist:  None  HPI:  Bobby Lloyd is a 57 y.o. male who is being seen today for the evaluation of dyslipidemia at the request of No ref. provider found. this is a pleasant 57 year old male kindly referred for evaluation management of dyslipidemia.  He works as a Dance movement psychotherapist.  He does report family history of heart disease including his mother who had a stent in her 2s and his father who died but had three-vessel bypass in his 14s.  He has a history of high cholesterol most recently lipids show total cholesterol 278 triglycerides 196 HDL 30 and LDL 210.  Findings are of course suggestive of possible familial hyperlipidemia.  He tells me at one time however his cholesterol was much lower.  This was also associated with a lower weight and feels that weight gain and less activity are mostly responsible for this.  Other risk factors include type 2 diabetes with some nephropathy however his A1c did reverse with diet and weight control.  He is also listed as having stage III chronic kidney disease but his creatinine recently was reasonably normal.  He has known fatty liver with elevated liver enzymes and previously see Dr. Ewing Schlein for this.  He was advised to stop the statin because of elevated liver enzymes and his last ALT in November 2022 was 112.  He has not had repeat lipid testing since then.  He is currently physically active and exercises with the son at the gym.  He denies any shortness of breath or chest pain with exertion.  06/22/2022  [name] returns today for follow-up.  He has done well over the past year on Repatha.  He is LDL particle number has come down from [phone].  LDL-C now 81, down from 202.  HDL-C is 35, up from 28.  His small LDL particle number is 661, down from 1575.  Overall a significant improvement.  He tells me recently he has also  been started on Ozempic.  He is hopeful that this will be associated with more weight loss which I suspect will help with his lipids as well.  04/18/2023  [name] is seen today in follow-up.  He recently saw Eligha Bridegroom, NP and had been complaining of some worsening fatigue.  Given his history of some coronary calcification she ordered a coronary CT angiogram.  This was performed on 04/06/2023 and showed a calcium score of 17.4, 55th percentile with mild nonobstructive coronary disease.  Overall very reassuring.  The radiology overread is pending however I looked at it with him today and there do appear to be some pulmonary blebs.  He has no known emphysema and was not a smoker although did have secondhand smoke exposure.  He had had some worsening fatigue and notably elevated liver enzymes which is since normalized.  He says he is actually feeling somewhat better and less fatigued recently.  Recent lipids were significantly abnormal however he had issues getting restarted on Repatha.  That has now since worked out and he is going to restart taking his medication.  PMHx:  Past Medical History:  Diagnosis Date   Allergic    SEASONAL   Depression    ANXIETY   Diabetes (HCC) 2015   FH: colonic polyps    Hematuria    Hypercholesteremia    IBS (irritable bowel syndrome)     History reviewed. No pertinent  surgical history.  FAMHx:  Family History  Problem Relation Age of Onset   CAD Mother    CAD Father     SOCHx:   reports that he has never smoked. He does not have any smokeless tobacco history on file. He reports current alcohol use of about 1.0 - 2.0 standard drink of alcohol per week. No history on file for drug use.  ALLERGIES:  Allergies  Allergen Reactions   Lipitor [Atorvastatin] Other (See Comments)    MYALGIA    Rosuvastatin     Liver problems    Tamiflu [Oseltamivir Phosphate] Nausea Only    ROS: Pertinent items noted in HPI and remainder of comprehensive ROS  otherwise negative.  HOME MEDS: Current Outpatient Medications on File Prior to Visit  Medication Sig Dispense Refill   albuterol (PROVENTIL HFA;VENTOLIN HFA) 108 (90 BASE) MCG/ACT inhaler Inhale 2 puffs into the lungs every 4 (four) hours as needed for wheezing or shortness of breath.     DULoxetine (CYMBALTA) 30 MG capsule Take 30 mg by mouth daily.     Evolocumab (REPATHA SURECLICK) 140 MG/ML SOAJ Inject 140 mg into the skin every 14 (fourteen) days. 6 mL 3   melatonin 3 MG TABS tablet Take 3 mg by mouth at bedtime.     metoprolol tartrate (LOPRESSOR) 50 MG tablet Take one (1) tablet by mouth ( 50 mg) 2 hours prior to CT scan. 1 tablet 0   Naproxen Sodium (ALEVE) 220 MG CAPS Take by mouth.     OZEMPIC, 0.25 OR 0.5 MG/DOSE, 2 MG/3ML SOPN Inject into the skin.     pantoprazole (PROTONIX) 40 MG tablet Take 40 mg by mouth daily.     aspirin EC 81 MG tablet Take 1 tablet (81 mg total) by mouth daily. Swallow whole. (Patient not taking: Reported on 04/18/2023)     nitroGLYCERIN (NITROSTAT) 0.4 MG SL tablet Place 1 tablet (0.4 mg total) under the tongue every 5 (five) minutes as needed for chest pain. (Patient not taking: Reported on 04/18/2023) 25 tablet 3   No current facility-administered medications on file prior to visit.    LABS/IMAGING: No results found for this or any previous visit (from the past 48 hour(s)). No results found.  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   Mr. Mardella Layman has had an excellent response to Repatha but recently had to come off of the medication due to insurance issues.  He is now restarting it.  He had fatigue and underwent coronary calcium scoring and a CT coronary angiogram which showed minimal nonobstructive coronary disease.  There are incidentally noted pulmonary blebs on my review and the radiology report is pending.  This likely will be sent to [facility] and hopefully she will follow-up with him.  I would recommend repeat lipids in 6 months once he reestablishes  on Repatha and we will recheck liver enzymes at that time as well.  Chrystie Nose, MD, [facility] FACP  [place]  [facility]  Medical Director of the Advanced Lipid Disorders &  Cardiovascular Risk Reduction Clinic Diplomate of the American Board of Clinical Lipidology Attending Cardiologist  Direct Dial: [phone]  Fax: [phone]  Website:  [email or web address] Nicely Sunday Klos 04/18/2023, 4:15 PM

## 2023-05-03 ENCOUNTER — Encounter (HOSPITAL_BASED_OUTPATIENT_CLINIC_OR_DEPARTMENT_OTHER): Payer: Self-pay | Admitting: Internal Medicine

## 2023-09-18 ENCOUNTER — Encounter (HOSPITAL_BASED_OUTPATIENT_CLINIC_OR_DEPARTMENT_OTHER): Payer: Self-pay

## 2023-09-18 ENCOUNTER — Institutional Professional Consult (permissible substitution): Payer: PRIVATE HEALTH INSURANCE | Admitting: Internal Medicine

## 2023-09-18 ENCOUNTER — Ambulatory Visit (HOSPITAL_BASED_OUTPATIENT_CLINIC_OR_DEPARTMENT_OTHER): Payer: PRIVATE HEALTH INSURANCE | Admitting: Internal Medicine

## 2023-09-18 VITALS — BP 120/86 | HR 79 | Ht 68.0 in | Wt 234.0 lb

## 2023-09-18 DIAGNOSIS — K719 Toxic liver disease, unspecified: Secondary | ICD-10-CM | POA: Diagnosis not present

## 2023-09-18 DIAGNOSIS — T466X5D Adverse effect of antihyperlipidemic and antiarteriosclerotic drugs, subsequent encounter: Secondary | ICD-10-CM

## 2023-09-18 DIAGNOSIS — R748 Abnormal levels of other serum enzymes: Secondary | ICD-10-CM | POA: Diagnosis not present

## 2023-09-18 DIAGNOSIS — E785 Hyperlipidemia, unspecified: Secondary | ICD-10-CM | POA: Diagnosis not present

## 2023-09-18 DIAGNOSIS — K7581 Nonalcoholic steatohepatitis (NASH): Secondary | ICD-10-CM

## 2023-09-18 DIAGNOSIS — T466X5A Adverse effect of antihyperlipidemic and antiarteriosclerotic drugs, initial encounter: Secondary | ICD-10-CM

## 2023-09-18 NOTE — Patient Instructions (Signed)
 Medication Instructions:  NO CHANGES   Lab Work: FASTING lab work in 1 year  Follow-Up: At [facility], you and your health needs are our priority.  As part of our continuing mission to provide you with exceptional heart care, we have created designated Provider Care Teams.  These Care Teams include your primary Cardiologist (physician) and Advanced Practice Providers (APPs -  Physician Assistants and Nurse Practitioners) who all work together to provide you with the care you need, when you need it.  We recommend signing up for the patient portal called "MyChart".  Sign up information is provided on this After Visit Summary.  MyChart is used to connect with patients for Virtual Visits (Telemedicine).  Patients are able to view lab/test results, encounter notes, upcoming appointments, etc.  Non-urgent messages can be sent to your provider as well.   To learn more about what you can do with MyChart, go to [email or web address].    Your next appointment:   12 months with Dr. Rennis Golden or Eligha Bridegroom, NP -- lipid clinic  Other Instructions You have been referred to Annamarie Major, NP with [facility] - hepatology

## 2023-09-18 NOTE — Progress Notes (Signed)
 LIPID CLINIC CONSULT NOTE  Chief Complaint:  Manage dyslipidemia  Primary Care Physician: Ollen Bowl, MD  Primary Cardiologist:  None  HPI:  Bobby Lloyd is a 58 y.o. male who is being seen today for the evaluation of dyslipidemia at the request of Pahwani, Kasandra Knudsen, MD. this is a pleasant 58 year old male kindly referred for evaluation management of dyslipidemia.  He works as a Dance movement psychotherapist.  He does report family history of heart disease including his mother who had a stent in her 43s and his father who died but had three-vessel bypass in his 53s.  He has a history of high cholesterol most recently lipids show total cholesterol 278 triglycerides 196 HDL 30 and LDL 210.  Findings are of course suggestive of possible familial hyperlipidemia.  He tells me at one time however his cholesterol was much lower.  This was also associated with a lower weight and feels that weight gain and less activity are mostly responsible for this.  Other risk factors include type 2 diabetes with some nephropathy however his A1c did reverse with diet and weight control.  He is also listed as having stage III chronic kidney disease but his creatinine recently was reasonably normal.  He has known fatty liver with elevated liver enzymes and previously see Dr. Ewing Schlein for this.  He was advised to stop the statin because of elevated liver enzymes and his last ALT in November 2022 was 112.  He has not had repeat lipid testing since then.  He is currently physically active and exercises with the son at the gym.  He denies any shortness of breath or chest pain with exertion.  06/22/2022  Bobby Lloyd returns today for follow-up.  He has done well over the past year on Repatha.  He is LDL particle number has come down from [phone].  LDL-C now 81, down from 202.  HDL-C is 35, up from 28.  His small LDL particle number is 661, down from 1575.  Overall a significant improvement.  He tells me recently he has also  been started on Ozempic.  He is hopeful that this will be associated with more weight loss which I suspect will help with his lipids as well.  04/18/2023  Bobby Lloyd is seen today in follow-up.  He recently saw Eligha Bridegroom, NP and had been complaining of some worsening fatigue.  Given his history of some coronary calcification she ordered a coronary CT angiogram.  This was performed on 04/06/2023 and showed a calcium score of 17.4, 55th percentile with mild nonobstructive coronary disease.  Overall very reassuring.  The radiology overread is pending however I looked at it with him today and there do appear to be some pulmonary blebs.  He has no known emphysema and was not a smoker although did have secondhand smoke exposure.  He had had some worsening fatigue and notably elevated liver enzymes which is since normalized.  He says he is actually feeling somewhat better and less fatigued recently.  Recent lipids were significantly abnormal however he had issues getting restarted on Repatha.  That has now since worked out and he is going to restart taking his medication.  09/18/2023  Bobby Lloyd is seen today in follow-up.  He just had his labs drawn on Friday therefore and has not totally resulted yet.  His lipids are still pending but his AST and ALT did result higher at 76 and 146 respectively.  Fasting blood glucose was also high at 173.  He  has a history of elevated liver enzymes previously on statins with known fatty infiltration of the liver.  This was worse with statins but had not been an issue on Repatha.  He also recently was started on Ozempic.  There is conflicting data suggesting that it might improve liver enzymes on possibly be associated rarely with worsening liver enzymes.  Nonetheless it might benefit from further imaging, possibly FibroScan given new therapies that are available.  PMHx:  Past Medical History:  Diagnosis Date   Allergic    SEASONAL   Depression    ANXIETY   Diabetes  (HCC) 2015   FH: colonic polyps    Hematuria    Hypercholesteremia    IBS (irritable bowel syndrome)     No past surgical history on file.  FAMHx:  Family History  Problem Relation Age of Onset   CAD Mother    CAD Father     SOCHx:   reports that he has never smoked. He does not have any smokeless tobacco history on file. He reports current alcohol use of about 1.0 - 2.0 standard drink of alcohol per week. No history on file for drug use.  ALLERGIES:  Allergies  Allergen Reactions   Lipitor [Atorvastatin] Other (See Comments)    MYALGIA    Rosuvastatin     Liver problems    Tamiflu [Oseltamivir Phosphate] Nausea Only    ROS: Pertinent items noted in HPI and remainder of comprehensive ROS otherwise negative.  HOME MEDS: Current Outpatient Medications on File Prior to Visit  Medication Sig Dispense Refill   albuterol (PROVENTIL HFA;VENTOLIN HFA) 108 (90 BASE) MCG/ACT inhaler Inhale 2 puffs into the lungs every 4 (four) hours as needed for wheezing or shortness of breath.     aspirin EC 81 MG tablet Take 1 tablet (81 mg total) by mouth daily. Swallow whole.     DULoxetine (CYMBALTA) 30 MG capsule Take 30 mg by mouth daily.     Evolocumab (REPATHA SURECLICK) 140 MG/ML SOAJ Inject 140 mg into the skin every 14 (fourteen) days. 6 mL 3   melatonin 3 MG TABS tablet Take 3 mg by mouth at bedtime.     OZEMPIC, 0.25 OR 0.5 MG/DOSE, 2 MG/3ML SOPN Inject into the skin.     pantoprazole (PROTONIX) 40 MG tablet Take 40 mg by mouth daily.     metoprolol tartrate (LOPRESSOR) 50 MG tablet Take one (1) tablet by mouth ( 50 mg) 2 hours prior to CT scan. (Patient not taking: Reported on 09/18/2023) 1 tablet 0   Naproxen Sodium (ALEVE) 220 MG CAPS Take by mouth. (Patient not taking: Reported on 09/18/2023)     nitroGLYCERIN (NITROSTAT) 0.4 MG SL tablet Place 1 tablet (0.4 mg total) under the tongue every 5 (five) minutes as needed for chest pain. (Patient not taking: Reported on 04/18/2023) 25  tablet 3   No current facility-administered medications on file prior to visit.    LABS/IMAGING: No results found for this or any previous visit (from the past 48 hours). No results found.  LIPID PANEL: No results found for: "CHOL", "TRIG", "HDL", "CHOLHDL", "VLDL", "LDLCALC", "LDLDIRECT"  WEIGHTS: Wt Readings from Last 3 Encounters:  04/18/23 227 lb (103 kg)  03/29/23 224 lb (101.6 kg)  06/22/22 227 lb (103 kg)    VITALS: There were no vitals taken for this visit.  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   Bobby Lloyd is tolerating Repatha but his lipids are not yet back.  I will contact him with those results.  His liver enzymes are elevated more than 3 times upper limit of normal.  He reports drinking about 1-2 beers a week which I recommended against.  He started on Ozempic fairly recently which could be a factor's or may be his Repatha however unlikely.  I would like to refer him to Annamarie Major, NP with [facility] hepatology for further evaluation and recommendations.  Plan otherwise follow-up with Eligha Bridegroom, NP in 1 year or sooner as necessary.  Chrystie Nose, MD, [facility], FACP  [place]  [facility]  Medical Director of the Advanced Lipid Disorders &  Cardiovascular Risk Reduction Clinic Diplomate of the American Board of Clinical Lipidology Attending Cardiologist  Direct Dial: [phone]  Fax: [phone]  Website:  [email or web address]  Lisette Abu Eleesha Purkey 09/18/2023, 8:11 AM

## 2023-09-19 ENCOUNTER — Encounter: Payer: Self-pay | Admitting: *Deleted

## 2023-09-19 LAB — COMPREHENSIVE METABOLIC PANEL
ALT: 146 IU/L — ABNORMAL HIGH (ref 0–44)
AST: 76 IU/L — ABNORMAL HIGH (ref 0–40)
Albumin: 4.6 g/dL (ref 3.8–4.9)
Alkaline Phosphatase: 116 IU/L (ref 44–121)
BUN/Creatinine Ratio: 12 (ref 9–20)
BUN: 15 mg/dL (ref 6–24)
Bilirubin Total: 0.5 mg/dL (ref 0.0–1.2)
CO2: 22 mmol/L (ref 20–29)
Calcium: 9.7 mg/dL (ref 8.7–10.2)
Chloride: 102 mmol/L (ref 96–106)
Creatinine, Ser: 1.25 mg/dL (ref 0.76–1.27)
Globulin, Total: 2.4 g/dL (ref 1.5–4.5)
Glucose: 173 mg/dL — ABNORMAL HIGH (ref 70–99)
Potassium: 4.9 mmol/L (ref 3.5–5.2)
Sodium: 140 mmol/L (ref 134–144)
Total Protein: 7 g/dL (ref 6.0–8.5)
eGFR: 67 mL/min/{1.73_m2} (ref >59)

## 2023-09-19 LAB — NMR, LIPOPROFILE
Cholesterol, Total: 168 mg/dL (ref 100–199)
HDL Particle Number: 26.9 umol/L — ABNORMAL LOW (ref >=30.5)
HDL-C: 32 mg/dL — ABNORMAL LOW (ref >39)
LDL Particle Number: 1433 nmol/L — ABNORMAL HIGH (ref <1000)
LDL Size: 20.5 nm — ABNORMAL LOW (ref >20.5)
LDL-C (NIH Calc): 99 mg/dL (ref 0–99)
LP-IR Score: 85 — ABNORMAL HIGH (ref <=45)
Small LDL Particle Number: 722 nmol/L — ABNORMAL HIGH (ref <=527)
Triglycerides: 213 mg/dL — ABNORMAL HIGH (ref 0–149)

## 2023-11-29 ENCOUNTER — Other Ambulatory Visit: Payer: Self-pay | Admitting: Pharmacist

## 2023-11-29 MED ORDER — REPATHA SURECLICK 140 MG/ML ~~LOC~~ SOAJ: 1.0000 | SUBCUTANEOUS | 3 refills | Status: AC

## 2024-04-09 ENCOUNTER — Other Ambulatory Visit (HOSPITAL_COMMUNITY): Payer: Self-pay

## 2024-04-09 ENCOUNTER — Telehealth: Payer: Self-pay | Admitting: Pharmacy Technician

## 2024-04-09 NOTE — Telephone Encounter (Signed)
   Pharmacy Patient Advocate Encounter   Received notification from Onbase that prior authorization for repatha  is required/requested.   Insurance verification completed.   The patient is insured through [employer] .   Per test claim: PA required; PA submitted to above mentioned insurance via Latent Key/confirmation #/EOC [facility] Status is pending

## 2024-04-09 NOTE — Telephone Encounter (Signed)
 Pharmacy Patient Advocate Encounter  Received notification from [facility] [employer] that Prior Authorization for repatha  has been APPROVED from 04/09/24 to 04/09/25   PA #/Case ID/Reference #: [id number]

## 2024-08-15 ENCOUNTER — Other Ambulatory Visit: Payer: Self-pay | Admitting: *Deleted

## 2024-08-15 DIAGNOSIS — E785 Hyperlipidemia, unspecified: Secondary | ICD-10-CM

## 2024-10-03 ENCOUNTER — Ambulatory Visit: Payer: PRIVATE HEALTH INSURANCE | Admitting: Internal Medicine
